# Patient Record
Sex: Female | Born: 1965 | Hispanic: No | Marital: Single | Smoking: Never smoker
Health system: Southern US, Community
[De-identification: ages and names within clinical notes are randomized; demographics above are authoritative.]

## PROBLEM LIST (undated history)

## (undated) DIAGNOSIS — T7840XA Allergy, unspecified, initial encounter: Secondary | ICD-10-CM

## (undated) DIAGNOSIS — G932 Benign intracranial hypertension: Secondary | ICD-10-CM

## (undated) DIAGNOSIS — K2970 Gastritis, unspecified, without bleeding: Secondary | ICD-10-CM

## (undated) DIAGNOSIS — E119 Type 2 diabetes mellitus without complications: Secondary | ICD-10-CM

## (undated) DIAGNOSIS — N200 Calculus of kidney: Secondary | ICD-10-CM

## (undated) HISTORY — PX: EYE SURGERY: SHX253

## (undated) HISTORY — PX: TUBAL LIGATION: SHX77

## (undated) HISTORY — DX: Allergy, unspecified, initial encounter: T78.40XA

## (undated) HISTORY — PX: HERNIA REPAIR: SHX51

## (undated) HISTORY — DX: Benign intracranial hypertension: G93.2

## (undated) HISTORY — DX: Type 2 diabetes mellitus without complications: E11.9

## (undated) HISTORY — DX: Calculus of kidney: N20.0

---

## 1997-10-31 ENCOUNTER — Other Ambulatory Visit: Admission: RE | Admit: 1997-10-31 | Discharge: 1997-10-31 | Payer: Self-pay | Admitting: Obstetrics & Gynecology

## 1997-11-11 ENCOUNTER — Inpatient Hospital Stay (HOSPITAL_COMMUNITY): Admission: AD | Admit: 1997-11-11 | Discharge: 1997-11-13 | Payer: Self-pay | Admitting: Obstetrics and Gynecology

## 1997-11-15 ENCOUNTER — Encounter: Admission: RE | Admit: 1997-11-15 | Discharge: 1998-02-13 | Payer: Self-pay | Admitting: Obstetrics & Gynecology

## 1997-12-11 ENCOUNTER — Encounter: Admission: RE | Admit: 1997-12-11 | Discharge: 1998-03-11 | Payer: Self-pay | Admitting: *Deleted

## 1998-10-12 ENCOUNTER — Other Ambulatory Visit: Admission: RE | Admit: 1998-10-12 | Discharge: 1998-10-12 | Payer: Self-pay | Admitting: Obstetrics & Gynecology

## 1998-12-17 ENCOUNTER — Inpatient Hospital Stay (HOSPITAL_COMMUNITY): Admission: AD | Admit: 1998-12-17 | Discharge: 1998-12-17 | Payer: Self-pay | Admitting: Obstetrics and Gynecology

## 1998-12-21 ENCOUNTER — Observation Stay (HOSPITAL_COMMUNITY): Admission: AD | Admit: 1998-12-21 | Discharge: 1998-12-22 | Payer: Self-pay | Admitting: *Deleted

## 1998-12-22 ENCOUNTER — Encounter: Payer: Self-pay | Admitting: *Deleted

## 1998-12-23 ENCOUNTER — Inpatient Hospital Stay (HOSPITAL_COMMUNITY): Admission: AD | Admit: 1998-12-23 | Discharge: 1998-12-23 | Payer: Self-pay | Admitting: Obstetrics and Gynecology

## 1999-01-07 ENCOUNTER — Observation Stay (HOSPITAL_COMMUNITY): Admission: AD | Admit: 1999-01-07 | Discharge: 1999-01-08 | Payer: Self-pay | Admitting: Obstetrics and Gynecology

## 1999-01-15 ENCOUNTER — Observation Stay (HOSPITAL_COMMUNITY): Admission: AD | Admit: 1999-01-15 | Discharge: 1999-01-16 | Payer: Self-pay | Admitting: Obstetrics and Gynecology

## 1999-01-26 ENCOUNTER — Observation Stay (HOSPITAL_COMMUNITY): Admission: AD | Admit: 1999-01-26 | Discharge: 1999-01-26 | Payer: Self-pay | Admitting: Obstetrics and Gynecology

## 1999-06-28 ENCOUNTER — Observation Stay (HOSPITAL_COMMUNITY): Admission: AD | Admit: 1999-06-28 | Discharge: 1999-06-29 | Payer: Self-pay | Admitting: *Deleted

## 1999-07-21 ENCOUNTER — Inpatient Hospital Stay (HOSPITAL_COMMUNITY): Admission: AD | Admit: 1999-07-21 | Discharge: 1999-07-23 | Payer: Self-pay | Admitting: Obstetrics & Gynecology

## 1999-10-19 ENCOUNTER — Emergency Department (HOSPITAL_COMMUNITY): Admission: EM | Admit: 1999-10-19 | Discharge: 1999-10-19 | Payer: Self-pay | Admitting: Emergency Medicine

## 1999-10-19 ENCOUNTER — Encounter: Payer: Self-pay | Admitting: Emergency Medicine

## 2000-02-18 ENCOUNTER — Other Ambulatory Visit: Admission: RE | Admit: 2000-02-18 | Discharge: 2000-02-18 | Payer: Self-pay | Admitting: Obstetrics and Gynecology

## 2000-10-10 ENCOUNTER — Emergency Department (HOSPITAL_COMMUNITY): Admission: EM | Admit: 2000-10-10 | Discharge: 2000-10-10 | Payer: Self-pay | Admitting: Emergency Medicine

## 2000-11-06 ENCOUNTER — Encounter: Admission: RE | Admit: 2000-11-06 | Discharge: 2000-11-06 | Payer: Self-pay | Admitting: Internal Medicine

## 2000-11-06 ENCOUNTER — Encounter: Payer: Self-pay | Admitting: Internal Medicine

## 2001-02-05 ENCOUNTER — Other Ambulatory Visit: Admission: RE | Admit: 2001-02-05 | Discharge: 2001-02-05 | Payer: Self-pay | Admitting: Internal Medicine

## 2001-02-26 ENCOUNTER — Encounter: Payer: Self-pay | Admitting: Internal Medicine

## 2001-02-26 ENCOUNTER — Encounter: Admission: RE | Admit: 2001-02-26 | Discharge: 2001-02-26 | Payer: Self-pay | Admitting: Internal Medicine

## 2001-04-13 ENCOUNTER — Emergency Department (HOSPITAL_COMMUNITY): Admission: EM | Admit: 2001-04-13 | Discharge: 2001-04-13 | Payer: Self-pay | Admitting: Emergency Medicine

## 2001-04-13 ENCOUNTER — Encounter: Payer: Self-pay | Admitting: Emergency Medicine

## 2001-06-11 ENCOUNTER — Other Ambulatory Visit: Admission: RE | Admit: 2001-06-11 | Discharge: 2001-06-11 | Payer: Self-pay | Admitting: Internal Medicine

## 2001-09-04 ENCOUNTER — Other Ambulatory Visit: Admission: RE | Admit: 2001-09-04 | Discharge: 2001-09-04 | Payer: Self-pay | Admitting: Internal Medicine

## 2003-07-31 ENCOUNTER — Emergency Department (HOSPITAL_COMMUNITY): Admission: AD | Admit: 2003-07-31 | Discharge: 2003-07-31 | Payer: Self-pay | Admitting: Family Medicine

## 2003-08-24 ENCOUNTER — Emergency Department (HOSPITAL_COMMUNITY): Admission: AD | Admit: 2003-08-24 | Discharge: 2003-08-24 | Payer: Self-pay | Admitting: Internal Medicine

## 2003-08-24 ENCOUNTER — Encounter: Payer: Self-pay | Admitting: Internal Medicine

## 2005-04-24 ENCOUNTER — Emergency Department (HOSPITAL_COMMUNITY): Admission: EM | Admit: 2005-04-24 | Discharge: 2005-04-24 | Payer: Self-pay | Admitting: Family Medicine

## 2005-07-12 ENCOUNTER — Other Ambulatory Visit: Admission: RE | Admit: 2005-07-12 | Discharge: 2005-07-12 | Payer: Self-pay | Admitting: Obstetrics and Gynecology

## 2005-09-16 ENCOUNTER — Encounter: Admission: RE | Admit: 2005-09-16 | Discharge: 2005-09-16 | Payer: Self-pay | Admitting: Internal Medicine

## 2006-04-13 ENCOUNTER — Other Ambulatory Visit: Admission: RE | Admit: 2006-04-13 | Discharge: 2006-04-13 | Payer: Self-pay | Admitting: Obstetrics and Gynecology

## 2007-02-01 ENCOUNTER — Encounter: Admission: RE | Admit: 2007-02-01 | Discharge: 2007-02-01 | Payer: Self-pay | Admitting: Internal Medicine

## 2007-08-12 ENCOUNTER — Emergency Department (HOSPITAL_COMMUNITY): Admission: EM | Admit: 2007-08-12 | Discharge: 2007-08-12 | Payer: Self-pay | Admitting: Emergency Medicine

## 2008-09-12 ENCOUNTER — Encounter: Admission: RE | Admit: 2008-09-12 | Discharge: 2008-09-12 | Payer: Self-pay | Admitting: Internal Medicine

## 2009-01-21 ENCOUNTER — Encounter: Admission: RE | Admit: 2009-01-21 | Discharge: 2009-01-21 | Payer: Self-pay | Admitting: Ophthalmology

## 2009-02-03 ENCOUNTER — Encounter: Admission: RE | Admit: 2009-02-03 | Discharge: 2009-02-03 | Payer: Self-pay | Admitting: Ophthalmology

## 2009-02-17 ENCOUNTER — Other Ambulatory Visit: Admission: RE | Admit: 2009-02-17 | Discharge: 2009-02-17 | Payer: Self-pay | Admitting: Ophthalmology

## 2009-02-17 ENCOUNTER — Encounter: Admission: RE | Admit: 2009-02-17 | Discharge: 2009-02-17 | Payer: Self-pay | Admitting: Ophthalmology

## 2009-02-17 ENCOUNTER — Encounter (INDEPENDENT_AMBULATORY_CARE_PROVIDER_SITE_OTHER): Payer: Self-pay | Admitting: Radiology

## 2009-12-15 ENCOUNTER — Encounter: Admission: RE | Admit: 2009-12-15 | Discharge: 2009-12-15 | Payer: Self-pay | Admitting: Internal Medicine

## 2010-08-22 ENCOUNTER — Encounter: Payer: Self-pay | Admitting: Internal Medicine

## 2010-08-23 ENCOUNTER — Encounter: Payer: Self-pay | Admitting: Internal Medicine

## 2010-09-25 ENCOUNTER — Emergency Department (HOSPITAL_COMMUNITY): Payer: 59

## 2010-09-25 ENCOUNTER — Emergency Department (HOSPITAL_COMMUNITY)
Admission: EM | Admit: 2010-09-25 | Discharge: 2010-09-25 | Disposition: A | Discharge status: Home / Self Care | Payer: 59 | Attending: Emergency Medicine | Admitting: Emergency Medicine

## 2010-09-25 DIAGNOSIS — R109 Unspecified abdominal pain: Secondary | ICD-10-CM | POA: Insufficient documentation

## 2010-09-25 DIAGNOSIS — K5900 Constipation, unspecified: Secondary | ICD-10-CM | POA: Insufficient documentation

## 2010-09-25 DIAGNOSIS — R071 Chest pain on breathing: Secondary | ICD-10-CM | POA: Insufficient documentation

## 2010-09-25 LAB — URINALYSIS, ROUTINE W REFLEX MICROSCOPIC
Hgb urine dipstick: NEGATIVE
Protein, ur: NEGATIVE mg/dL
Specific Gravity, Urine: 1.012 (ref 1.005–1.030)
Urine Glucose, Fasting: NEGATIVE mg/dL

## 2010-09-25 LAB — COMPREHENSIVE METABOLIC PANEL
ALT: 14 U/L (ref 0–35)
AST: 20 U/L (ref 0–37)
CO2: 24 mEq/L (ref 19–32)
Calcium: 8.8 mg/dL (ref 8.4–10.5)
Creatinine, Ser: 0.65 mg/dL (ref 0.4–1.2)
GFR calc non Af Amer: >60 mL/min (ref >60)
Glucose, Bld: 120 mg/dL — ABNORMAL HIGH (ref 70–99)
Sodium: 136 mEq/L (ref 135–145)
Total Protein: 7.8 g/dL (ref 6.0–8.3)

## 2010-09-25 LAB — POCT CARDIAC MARKERS
CKMB, poc: 1.1 ng/mL (ref 1.0–8.0)
Myoglobin, poc: 75.1 ng/mL (ref 12–200)

## 2010-12-17 NOTE — Discharge Summary (Signed)
[employer]  Patient:    Jasmine Bartlett                          MRN: [id number] Adm. Date:  [id number] Disc. Date: 07/23/99 Attending:  Cleatrice Burke DictatorVance Gather Duplantis, C.N.M.                           Discharge Summary  ADMISSION DIAGNOSES:          1. Intrauterine pregnancy at term.                               2. Spontaneous rupture of membranes.                               3. Early labor.                               4. 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                               5. Multiparity.                               6. Desires bilateral tubal ligation for                                  sterilization.  DISCHARGE DIAGNOSES:          1. Intrauterine pregnancy at term.                               2. Spontaneous rupture of membranes.                               3. Early labor.                               4. 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                               5. Multiparity.                               6. Desires bilateral tubal ligation for  sterilization.                               7. Breastfeeding.  PROCEDURE:                    1) Normal spontaneous vaginal delivery of a viable female infant who weighed 7 pounds 5 ounces and had Apgars of 8 and 9 on July 21, 1999, without complications.  She was delivered by Georgina Peer, M.D. 2) Bilateral tubal ligation for sterilization on July 22, 1999, by Cecilio Asper, M.D. also without complications.  HOSPITAL COURSE:              Jasmine Bartlett is a 45 year old black female, gravida , para 1-0-1-1, at 38-2/7 weeks admitted with rupture of membranes in early labor who progressed to a normal spontaneous vaginal delivery of a viable female infant who weighed 7 pounds 5 ounces and had  Apgars of 8 and 9 by Georgina Peer, M.D. without complications.  She also desired a bilateral tubal ligation and underwent the same on July 22, 1999, without complications by Cecilio Asper,  M.D.  Please see operative note for details.  Postoperatively and postpartally he has done well.  She is ambulating, voiding, and eating without difficulty.  She has not yet had a bowel movement and feels some discomfort secondary to gas, but is  tolerating a regular diet.  She is breastfeeding also without difficulty.  She s deemed ready for discharge today.  DISCHARGE INSTRUCTIONS:       As per the [facility] and Gynecology handout.  DISCHARGE MEDICATIONS:        1. Motrin 600 mg p.o. q.6h. p.r.n. for pain.                               2. Tylox one to two p.o. q.4-6h. p.r.n. for pain.                               3. Prenatal vitamins daily.  FOLLOW-UP:                    Will be in six weeks at [facility] and Gynecology.  DISCHARGE LABORATORY DATA:    Her hemoglobin is 9.7, WBC count 13.4, and platelets are 273. DD:  07/23/99 TD:  07/24/99 Job: [id number]

## 2010-12-17 NOTE — H&P (Signed)
[facility]  Patient:    Jasmine Bartlett                          MRN: [id number] Adm. Date:  [id number] Attending:  Pleas Koch Dictator:   Erin Sons, C.N.M.                         History and Physical  HISTORY OF PRESENT ILLNESS:   Mrs. Jasmine Bartlett is a 45 year old gravida 3, para 1-0-1-1, at 35-2/7 weeks, who presented to [facility] today with approximately  24-hour history of nausea and vomiting with questionable food poisoning since 11 p.m. last night.  She has had intractable vomiting since then.  Her husband nd child had similar symptoms, but they are now improved.  She denies any abdominal pain, cramping, dysuria, or back pain.  She ate greens and ham yesterday at approximately 2 p.m.  PAST HISTORY:                 The pregnancy has been remarkable for:                               1. Hyperemesis.                               2. History of HSV.                               3. Positive group B Streptococcus.                               4. Desires bilateral tubal ligation.                               5. History of hiatal hernia.  PRENATAL LABORATORY DATA:     Blood type is B positive.  Rh antibody negative.  VDRL nonreactive.  Rubella titer positive.  Hepatitis B surface antigen negative. Sickle cell test negative.  GC and Chlamydia cultures were negative.  Pap was normal.  Glucose challenge was normal.  AFP was normal.  Hemoglobin upon entry nto the practice was 12.9.  It was 11.8 at approximately 25-26 weeks.  EDC of August 01, 1999, was established by last menstrual period and was in agreement with ultrasound at approximately 18 weeks.  HISTORY OF PRESENT PREGNANCY: The patient entered care at approximately eight weeks.  She had significant nausea and vomiting in the early part of her pregnancy. She had several maternity admissions unit visits.  She was placed on home IV hydration, methylprednisolone p.o., and  home IV fluid therapy with Phenergan. he also had a Reglan top.  She was finally able to present for her new OB visit at  approximately 12 weeks.  Throughout the rest of her pregnancy she continued with sporadic maternity admission visits for nausea and vomiting.  She was diagnosed  with possible ringworm at 28 weeks and was treated for this.  She has elected bilateral tubal sterilization with permit signed in May 26, 1999.  The last few weeks of the patients pregnancy have been uncomplicated until the onset of the nausea and vomiting the  last 24 hours.  OBSTETRICAL HISTORY:          In 1994, she had a therapeutic termination of pregnancy.  In April 1999 she had a vaginal birth of a female infant at 7 pounds. She was 39 weeks.  She was in labor approximately 19 hours.  She had epidural anesthesia and vacuum-assisted vaginal birth.  She had hyperemesis with each of her pregnancies.  MEDICAL HISTORY:              She was on Tri-Levlen and Ortho-Novum 777 in the past. She had problems with bloating and nausea and vomiting.  She was on Micronor in  1999, but stopped in February.  In 1990 she had Chlamydia. She was diagnosed with HSV2 in 1994.  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  She has occasional yeast infections.  She does have history of beta Streptococcus with her previous pregnancy.  She has a history of hiatal hernia nd a history of chronic gastritis, but has never been on any medication.  She had n endoscopy in 1989 or 1990.  ALLERGIES:                    She has no known medication allergies.  FAMILY HISTORY:               Her maternal grandfather had an MI and is now deceased.  Her maternal grandmother and paternal grandmother were both adult-onset diabetics controlled by oral medications.  Her mother had glomerulonephritis. er maternal grandmother has Alzheimers.  Her maternal grandfather had prostate cancer.  GENETIC  HISTORY:              Unremarkable.  SOCIAL HISTORY:               The patient is married.  The father of the baby is generally supportive.  His name is Adelina Mings.  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  Her husband is high-school education and is employed in [employer]. She is African-American in ethnicity.  She is [place].  She denies any alcohol, drug, or tobacco use during this pregnancy.  PHYSICAL EXAMINATION  VITAL SIGNS:                  Stable.  Patient is afebrile.  HEENT:                        Within normal limits.  LUNGS:                        Bilateral breath sounds are clear.  CARDIAC:                      Heart regular rate and rhythm without murmur.  BREASTS:                      Soft and nontender.  ABDOMEN:                      Fundal height is approximately 35 cm.  Uterine contractions are every 16 minutes, mild quality.  PELVIC:                       Cervical exam is deferred.  Fetal heart rate is reactive in segments but did ave some sporadic mild variable noted.  There were some questionable baseline changes with some uterine contractions.  These may have been maternal tracing in some instances.  There is negative CVA tenderness.  LABORATORY DATA:              Clean catch urine at approximately 8 p.m. after approximately 1-1/2 hours of IV fluids showed specific gravity of 1.030, greater than 80 ketones, and 30 mg protein, and a trace of leukocyte esterase.  IMPRESSION:                   1. Intrauterine pregnancy at 35-2/7 weeks.                               2. Viral illness versus food exposure.                               3. Clinical fetal heart rate tracings probably                                  secondary to dehydration, now improving.  PLAN:                         1. Admit to [facility] for 23-hour observation                                  for rehydration and Phenergan IV.                                2. Will reevaluate status in the a.m. for possible                                  discharge.                               3. Will continue IV hydration throughout the night. DD:  06/28/99  TD:  06/28/99 Job: [id number]

## 2010-12-17 NOTE — Op Note (Signed)
[facility]  Patient:    Jasmine Bartlett                          MRN: [id number] Proc. Date: 07/22/99 Adm. Date:  [id number] Attending:  Cleatrice Burke                           Operative Report  PREOPERATIVE DIAGNOSIS:       Desires sterilization.  POSTOPERATIVE DIAGNOSIS:      Desires sterilization.  OPERATION:                    Postpartum bilateral tubal ligation.  SURGEON:                      Cecilio Asper, M.D.  ASSISTANT:  ANESTHESIA:                   Spinal.  ESTIMATED BLOOD LOSS:         Minimal.  FINDINGS:                     Normal tubes bilaterally.  COMPLICATIONS:                None.  INDICATIONS:                  The patient is a 45 year old, gravida 3, para 1-0-1-1, status post normal spontaneous vaginal delivery, desiring permanent sterilization.  The patient understands that the procedure is to be considered permanent and irreversible.  She understands there is a small risk of tubal failure and should failure occur, there is an increased risk of ectopic pregnancy.  She is therefore consented.  DESCRIPTION OF PROCEDURE:     After an adequate level of spinal anesthesia was obtained, the patient was prepped and draped in a sterile fashion.  An infraumbilical incision was made down through the subcutaneous tissue fat to the fascia.  The fascia was incised with the Mayo scissors.  The parietoperitoneum as grasped with Hemostats and incised with the Metzenbaum scissors.  Army-Navy retractors were placed inside of the incision.  The left fallopian tube was identified, followed out to its fimbriated end, elevated with a Babcock, and doubly tied with #0 plain.  A portion was cut and sent to pathology.  Hemostasis was assured.  The right fallopian tube was identified in like fashion, followed out to the fimbriated end, elevated with the [place], doubly tied with #0 plain.  A portion of this tube was sent to  pathology as well.  Hemostasis was assured. The fascia was reapproximated with a running suture of #1 Vicryl.  The skin was closed with a subcuticular #4-0 Vicryl.  10 cc of 0.5% Marcaine with epinephrine was injected into the incision.  Bandage was applied.  Sponge, needle, and instrument counts were correct x 2.  The patient tolerated the procedure well.  She was taken to the recovery room in stable condition. DD:  07/22/99 TD:  07/23/99 Job: [id number]

## 2011-01-17 ENCOUNTER — Other Ambulatory Visit: Payer: Self-pay | Admitting: Internal Medicine

## 2011-01-17 DIAGNOSIS — Z1231 Encounter for screening mammogram for malignant neoplasm of breast: Secondary | ICD-10-CM

## 2011-01-31 ENCOUNTER — Ambulatory Visit
Admission: RE | Admit: 2011-01-31 | Discharge: 2011-01-31 | Disposition: A | Discharge status: Home / Self Care | Payer: 59 | Source: Ambulatory Visit | Attending: Internal Medicine | Admitting: Internal Medicine

## 2011-01-31 DIAGNOSIS — Z1231 Encounter for screening mammogram for malignant neoplasm of breast: Secondary | ICD-10-CM

## 2011-04-21 LAB — URINALYSIS, ROUTINE W REFLEX MICROSCOPIC
Bilirubin Urine: NEGATIVE
Glucose, UA: NEGATIVE
Ketones, ur: 15 — AB
Nitrite: NEGATIVE
Protein, ur: NEGATIVE
Specific Gravity, Urine: 1.016
Urobilinogen, UA: 0.2
pH: 8

## 2011-04-21 LAB — COMPREHENSIVE METABOLIC PANEL
Alkaline Phosphatase: 99
BUN: 7
Calcium: 9.1
Creatinine, Ser: 0.79
Glucose, Bld: 114 — ABNORMAL HIGH
Total Protein: 7.8

## 2011-04-21 LAB — URINE MICROSCOPIC-ADD ON

## 2011-04-21 LAB — CBC
HCT: 36.5
Hemoglobin: 12.1
MCHC: 33.1
MCV: 86.2
RBC: 4.24
RDW: 14

## 2011-04-21 LAB — DIFFERENTIAL
Eosinophils Absolute: 0
Lymphs Abs: 0.7
Neutro Abs: 10 — ABNORMAL HIGH
Neutrophils Relative %: 89 — ABNORMAL HIGH

## 2011-04-21 LAB — PREGNANCY, URINE

## 2011-08-25 ENCOUNTER — Emergency Department (HOSPITAL_COMMUNITY): Payer: 59

## 2011-08-25 ENCOUNTER — Encounter (HOSPITAL_COMMUNITY): Payer: Self-pay | Admitting: *Deleted

## 2011-08-25 ENCOUNTER — Emergency Department (HOSPITAL_COMMUNITY)
Admission: EM | Admit: 2011-08-25 | Discharge: 2011-08-25 | Disposition: A | Discharge status: Home / Self Care | Payer: 59 | Attending: Emergency Medicine | Admitting: Emergency Medicine

## 2011-08-25 DIAGNOSIS — K449 Diaphragmatic hernia without obstruction or gangrene: Secondary | ICD-10-CM | POA: Insufficient documentation

## 2011-08-25 DIAGNOSIS — R109 Unspecified abdominal pain: Secondary | ICD-10-CM | POA: Insufficient documentation

## 2011-08-25 DIAGNOSIS — N200 Calculus of kidney: Secondary | ICD-10-CM

## 2011-08-25 DIAGNOSIS — N201 Calculus of ureter: Secondary | ICD-10-CM | POA: Insufficient documentation

## 2011-08-25 DIAGNOSIS — R10819 Abdominal tenderness, unspecified site: Secondary | ICD-10-CM | POA: Insufficient documentation

## 2011-08-25 LAB — CBC
HCT: 38.4 % (ref 36.0–46.0)
Hemoglobin: 12.4 g/dL (ref 12.0–15.0)
MCHC: 32.3 g/dL (ref 30.0–36.0)
MCV: 84.4 fL (ref 78.0–100.0)
RDW: 15.3 % (ref 11.5–15.5)

## 2011-08-25 LAB — URINE MICROSCOPIC-ADD ON

## 2011-08-25 LAB — WET PREP, GENITAL

## 2011-08-25 LAB — COMPREHENSIVE METABOLIC PANEL
Albumin: 4.2 g/dL (ref 3.5–5.2)
BUN: 9 mg/dL (ref 6–23)
CO2: 25 mEq/L (ref 19–32)
Calcium: 9.3 mg/dL (ref 8.4–10.5)
Chloride: 105 mEq/L (ref 96–112)
Creatinine, Ser: 0.71 mg/dL (ref 0.50–1.10)
GFR calc non Af Amer: >90 mL/min (ref >90)
Total Bilirubin: 0.2 mg/dL — ABNORMAL LOW (ref 0.3–1.2)

## 2011-08-25 LAB — DIFFERENTIAL
Basophils Absolute: 0 10*3/uL (ref 0.0–0.1)
Basophils Relative: 0 % (ref 0–1)
Eosinophils Relative: 1 % (ref 0–5)
Monocytes Absolute: 0.8 10*3/uL (ref 0.1–1.0)
Monocytes Relative: 6 % (ref 3–12)
Neutro Abs: 10.6 10*3/uL — ABNORMAL HIGH (ref 1.7–7.7)

## 2011-08-25 LAB — PREGNANCY, URINE: Preg Test, Ur: NEGATIVE

## 2011-08-25 LAB — URINALYSIS, ROUTINE W REFLEX MICROSCOPIC
Ketones, ur: NEGATIVE mg/dL
Leukocytes, UA: NEGATIVE
Nitrite: NEGATIVE
Protein, ur: NEGATIVE mg/dL

## 2011-08-25 LAB — LIPASE, BLOOD: Lipase: 39 U/L (ref 11–59)

## 2011-08-25 MED ORDER — OXYCODONE-ACETAMINOPHEN 5-325 MG PO TABS: 2.0000 | ORAL_TABLET | ORAL | Status: AC | PRN

## 2011-08-25 MED ORDER — MORPHINE SULFATE 4 MG/ML IJ SOLN
4.0000 mg | Freq: Once | INTRAMUSCULAR | Status: AC
  Administered 2011-08-25: 4 mg via INTRAVENOUS
  Filled 2011-08-25: qty 1

## 2011-08-25 MED ORDER — SODIUM CHLORIDE 0.9 % IV BOLUS (SEPSIS)
1000.0000 mL | Freq: Once | INTRAVENOUS | Status: AC
  Administered 2011-08-25: 1000 mL via INTRAVENOUS

## 2011-08-25 MED ORDER — IOHEXOL 300 MG/ML  SOLN
100.0000 mL | Freq: Once | INTRAMUSCULAR | Status: AC | PRN
  Administered 2011-08-25: 100 mL via INTRAVENOUS

## 2011-08-25 MED ORDER — KETOROLAC TROMETHAMINE 30 MG/ML IJ SOLN
30.0000 mg | Freq: Once | INTRAMUSCULAR | Status: AC
  Administered 2011-08-25: 30 mg via INTRAVENOUS
  Filled 2011-08-25: qty 1

## 2011-08-25 NOTE — ED Provider Notes (Signed)
History     CSN: [id number]  Arrival date & time 08/25/11  1525   None     Chief Complaint  Patient presents with  . Abdominal Pain    (Consider location/radiation/quality/duration/timing/severity/associated sxs/prior treatment) Patient is a 46 y.o. female presenting with abdominal pain. The history is provided by the patient. No language interpreter was used.  Abdominal Pain The primary symptoms of the illness include abdominal pain and vaginal bleeding. The primary symptoms of the illness do not include fever, shortness of breath, nausea, vomiting, diarrhea, dysuria or vaginal discharge. The onset of the illness was sudden. The problem has been gradually worsening.  The patient states that she believes she is currently not pregnant. The patient has not had a change in bowel habit. Symptoms associated with the illness do not include chills, anorexia, diaphoresis, heartburn, constipation, urgency, hematuria, frequency or back pain. Significant associated medical issues do not include PUD, GERD, inflammatory bowel disease, diabetes, sickle cell disease, gallstones, liver disease, substance abuse, diverticulitis or cardiac disease.   46 year old patient here today with complaint of mid abdominal pain. States that the pain came on around 12:30 to the right flank with a burning sensation that radiated to the right lower quadrant. Denies nausea vomiting or diarrhea. States the pain is 8/10 comes in waves at 10 out of 10. She took Aleve with some relief around 1:30. Past medical history of having her tubes tied. She also has a past medical history of constipation with fecal impaction. States that her last bowel movement this morning was normal no blood. She is on her menstrual cycle presently no dysuria no fever.  History reviewed. No pertinent past medical history.  History reviewed. No pertinent past surgical history.  No family history on file.  History  Substance Use Topics  . Smoking  status: Never Smoker   . Smokeless tobacco: Never Used  . Alcohol Use: Yes     occ    OB History    Grav Para Term Preterm Abortions TAB SAB Ect Mult Living                  Review of Systems  Constitutional: Negative for fever, chills and diaphoresis.  Respiratory: Negative for shortness of breath.   Gastrointestinal: Positive for abdominal pain. Negative for heartburn, nausea, vomiting, diarrhea, constipation and anorexia.  Genitourinary: Positive for vaginal bleeding. Negative for dysuria, urgency, frequency, hematuria and vaginal discharge.  Musculoskeletal: Negative for back pain.  All other systems reviewed and are negative.    Allergies  Diamox  Home Medications   Current Outpatient Rx  Name Route Sig Dispense Refill  . FOLIC ACID 1 MG PO TABS Oral Take 1 mg by mouth daily.    Marland Kitchen LORATADINE 10 MG PO TABS Oral Take 10 mg by mouth daily.    . ADULT MULTIVITAMIN W/MINERALS CH Oral Take 1 tablet by mouth daily.    Marland Kitchen NAPROXEN SODIUM 220 MG PO TABS Oral Take 440 mg by mouth daily as needed. pain    . RESTORA PO CAPS Oral Take 1 capsule by mouth daily.      BP 150/96  Pulse 91  Temp(Src) 98 F (36.7 C) (Oral)  Resp 20  Ht 5\' 1"  (1.549 m)  Wt 162 lb (73.483 kg)  BMI 30.61 kg/m2  SpO2 100%  LMP 08/23/2011  Physical Exam  Nursing note and vitals reviewed. Constitutional: She is oriented to person, place, and time. She appears well-developed and well-nourished.  HENT:  Head: Normocephalic and  atraumatic.  Eyes: Conjunctivae and EOM are normal. Pupils are equal, round, and reactive to light.  Neck: Normal range of motion. Neck supple.  Cardiovascular: Normal rate, regular rhythm, normal heart sounds and intact distal pulses.  Exam reveals no gallop and no friction rub.   No murmur heard. Pulmonary/Chest: Effort normal and breath sounds normal.  Abdominal: Soft. Bowel sounds are normal. She exhibits no distension. There is no hepatosplenomegaly, splenomegaly or  hepatomegaly. There is tenderness in the right lower quadrant and periumbilical area. There is no rebound, no guarding and no CVA tenderness. No hernia. Hernia confirmed negative in the ventral area, confirmed negative in the right inguinal area and confirmed negative in the left inguinal area.  Musculoskeletal: Normal range of motion. She exhibits no edema and no tenderness.  Neurological: She is alert and oriented to person, place, and time. She has normal reflexes.  Skin: Skin is warm and dry.  Psychiatric: She has a normal mood and affect.    ED Course  Procedures (including critical care time)   Labs Reviewed  CBC  DIFFERENTIAL  COMPREHENSIVE METABOLIC PANEL  LIPASE, BLOOD  URINALYSIS, ROUTINE W REFLEX MICROSCOPIC  PREGNANCY, URINE  GC/CHLAMYDIA PROBE AMP, GENITAL  WET PREP, GENITAL  POCT PREGNANCY, URINE   No results found.   No diagnosis found.    MDM  RLQ periumbilical pain that started today.  CT shows R kidney stone .5mm.  Will strain all urine.  Treat pain and follow up with urology tomorrow for R hydronephrosis.   Labs Reviewed  CBC - Abnormal; Notable for the following:    WBC 13.0 (*)    All other components within normal limits  DIFFERENTIAL - Abnormal; Notable for the following:    Neutrophils Relative 82 (*)    Neutro Abs 10.6 (*)    Lymphocytes Relative 11 (*)    All other components within normal limits  COMPREHENSIVE METABOLIC PANEL - Abnormal; Notable for the following:    Total Protein 9.0 (*)    Alkaline Phosphatase 136 (*)    Total Bilirubin 0.2 (*)    All other components within normal limits  URINALYSIS, ROUTINE W REFLEX MICROSCOPIC - Abnormal; Notable for the following:    Hgb urine dipstick MODERATE (*)    All other components within normal limits  WET PREP, GENITAL - Abnormal; Notable for the following:    WBC, Wet Prep HPF POC FEW (*)    All other components within normal limits  LIPASE, BLOOD  PREGNANCY, URINE  GC/CHLAMYDIA PROBE  AMP, GENITAL  URINE MICROSCOPIC-ADD ON  POCT PREGNANCY, URINE          Jethro Bastos, NP 08/26/11 1225

## 2011-08-25 NOTE — ED Notes (Signed)
Pt states she is on her period currently.

## 2011-08-25 NOTE — ED Notes (Signed)
Pt states today she started to have right side pain that radiates towards her pelvic area. Pt denies any n/v or urination difficulty

## 2011-08-25 NOTE — ED Notes (Signed)
Pt is alert and oriented x 4 with respirations even and unlabored.  NAD at this time.  Discharge instructions reviewed with patient and patient verbalized understanding.  Pt ambulated to lobby with steady gait and significant other to transport patient home.

## 2011-08-26 ENCOUNTER — Encounter (HOSPITAL_COMMUNITY): Payer: Self-pay | Admitting: Emergency Medicine

## 2011-08-26 ENCOUNTER — Emergency Department (HOSPITAL_COMMUNITY)
Admission: EM | Admit: 2011-08-26 | Discharge: 2011-08-27 | Disposition: A | Discharge status: Home / Self Care | Payer: 59 | Attending: Emergency Medicine | Admitting: Emergency Medicine

## 2011-08-26 DIAGNOSIS — R300 Dysuria: Secondary | ICD-10-CM | POA: Insufficient documentation

## 2011-08-26 DIAGNOSIS — R3915 Urgency of urination: Secondary | ICD-10-CM | POA: Insufficient documentation

## 2011-08-26 DIAGNOSIS — R231 Pallor: Secondary | ICD-10-CM | POA: Insufficient documentation

## 2011-08-26 DIAGNOSIS — Z87442 Personal history of urinary calculi: Secondary | ICD-10-CM | POA: Insufficient documentation

## 2011-08-26 DIAGNOSIS — R110 Nausea: Secondary | ICD-10-CM | POA: Insufficient documentation

## 2011-08-26 HISTORY — DX: Gastritis, unspecified, without bleeding: K29.70

## 2011-08-26 HISTORY — DX: Benign intracranial hypertension: G93.2

## 2011-08-26 LAB — POCT I-STAT, CHEM 8
HCT: 41 % (ref 36.0–46.0)
Hemoglobin: 13.9 g/dL (ref 12.0–15.0)
Sodium: 141 mEq/L (ref 135–145)
TCO2: 25 mmol/L (ref 0–100)

## 2011-08-26 MED ORDER — ONDANSETRON HCL 4 MG/2ML IJ SOLN
4.0000 mg | Freq: Once | INTRAMUSCULAR | Status: AC
  Administered 2011-08-26: 4 mg via INTRAVENOUS
  Filled 2011-08-26: qty 2

## 2011-08-26 MED ORDER — SODIUM CHLORIDE 0.9 % IV SOLN
Freq: Once | INTRAVENOUS | Status: AC
  Administered 2011-08-26: 21:00:00 via INTRAVENOUS

## 2011-08-26 NOTE — ED Notes (Signed)
Bladder scan 148ml

## 2011-08-26 NOTE — ED Notes (Signed)
PA at bedside.

## 2011-08-26 NOTE — ED Provider Notes (Signed)
Medical screening examination/treatment/procedure(s) were performed by non-physician practitioner and as supervising physician I was immediately available for consultation/collaboration.    Celene Kras, MD 08/26/11 [phone]

## 2011-08-26 NOTE — ED Notes (Signed)
Pt presented to the ER with c/o pelvic pain and pressure, pt states that she was seen yesterday in the ER for the flank/abd pain, pt at that time Dx with kidney stones, pt there after sent home with instructions and verbalized understanding, however, states that s/s worsen, n/v developed, started also to have urinary urgency and painful urination.

## 2011-08-27 MED ORDER — ONDANSETRON HCL 4 MG PO TABS: 4.0000 mg | ORAL_TABLET | Freq: Four times a day (QID) | ORAL | Status: AC

## 2011-08-27 NOTE — ED Provider Notes (Signed)
History     CSN: [id number]  Arrival date & time 08/26/11  1925   First MD Initiated Contact with Patient 08/26/11 2057      Chief Complaint  Patient presents with  . Urinary Urgency  . Nausea  . Emesis  . Pelvic Pain    pt states "pressure"    (Consider location/radiation/quality/duration/timing/severity/associated sxs/prior treatment) HPI Comments: Patient was diagnosed with kidney stones with right hydronephrosis.  Sent home with Percocet and instructions to make an appointment for followup with urology.  She has an appointment next Tuesday with, urologist.  Geronimo Running been taking the Percocet with relief of her pain, but she has been nauseated and unable to stay hydrated throughout the day presenting to the emergency room tonight with nausea and vomiting  The history is provided by the patient.    Past Medical History  Diagnosis Date  . Diabetes mellitus   . Pseudotumor cerebri     increase of ICF  . Gastritis     Past Surgical History  Procedure Date  . Tubal ligation   . Hernia repair     History reviewed. No pertinent family history.  History  Substance Use Topics  . Smoking status: Never Smoker   . Smokeless tobacco: Never Used  . Alcohol Use: Yes     occ    OB History    Grav Para Term Preterm Abortions TAB SAB Ect Mult Living                  Review of Systems  Constitutional: Negative for fever and chills.  Gastrointestinal: Positive for nausea and vomiting.  Genitourinary: Positive for dysuria and decreased urine volume. Negative for hematuria and flank pain.  Skin: Negative for pallor.  Neurological: Negative for dizziness.    Allergies  Diamox  Home Medications   Current Outpatient Rx  Name Route Sig Dispense Refill  . FOLIC ACID 1 MG PO TABS Oral Take 1 mg by mouth daily.    Marland Kitchen LORATADINE 10 MG PO TABS Oral Take 10 mg by mouth daily.    . ADULT MULTIVITAMIN W/MINERALS CH Oral Take 1 tablet by mouth daily.    Marland Kitchen NAPROXEN SODIUM 220 MG PO  TABS Oral Take 440 mg by mouth daily as needed. pain    . OXYCODONE-ACETAMINOPHEN 5-325 MG PO TABS Oral Take 2 tablets by mouth every 4 (four) hours as needed for pain. 12 tablet 0  . RESTORA PO CAPS Oral Take 1 capsule by mouth daily.    Marland Kitchen ONDANSETRON HCL 4 MG PO TABS Oral Take 1 tablet (4 mg total) by mouth every 6 (six) hours. 12 tablet 0    BP 132/75  Pulse 74  Temp(Src) 98.4 F (36.9 C) (Oral)  Resp 15  SpO2 100%  LMP 08/23/2011  Physical Exam  Constitutional: She is oriented to person, place, and time. She appears well-developed and well-nourished.  HENT:  Head: Normocephalic.  Eyes: Pupils are equal, round, and reactive to light.  Neck: Normal range of motion.  Cardiovascular: Normal rate.   Pulmonary/Chest: Effort normal.  Abdominal: Soft.  Musculoskeletal: Normal range of motion.  Neurological: She is alert and oriented to person, place, and time.  Skin: Skin is warm and dry. There is pallor.  Psychiatric: She has a normal mood and affect.    ED Course  Procedures (including critical care time)  Labs Reviewed  POCT I-STAT, CHEM 8 - Abnormal; Notable for the following:    Glucose, Bld 103 (*)  All other components within normal limits  I-STAT, CHEM 8   Ct Abdomen Pelvis W Contrast  08/25/2011  *RADIOLOGY REPORT*  Clinical Data: Acute onset right side abdominal pain for 1 day.  CT ABDOMEN AND PELVIS WITH CONTRAST  Technique:  Multidetector CT imaging of the abdomen and pelvis was performed following the standard protocol during bolus administration of intravenous contrast.  Contrast: OMNIPAQUE IOHEXOL 300 MG/ML IV SOLN  Comparison: Chest and two views abdomen 09/25/2010.  Findings: Lung bases are clear.  No pleural or pericardial effusion.  Small hiatal hernia is noted.  The patient's moderate to moderately severe right hydronephrosis with stranding about the right kidney and ureter.  There is a right ureteral stone just below the pelvic brim measuring 0.5 cm in  diameter.  No other urinary tract stones are seen on the right or left.  The left kidney appears normal.  The liver, gallbladder, spleen, adrenal glands and pancreas appear normal.  Uterus, adnexa and urinary bladder are unremarkable.  The small and large bowel are unremarkable.  The appendix is not visualized but no evidence inflammatory process is seen.  No fluid collection lymphadenopathy.  No focal bony abnormality.  IMPRESSION:  1.  Moderate to moderately severe right hydronephrosis due to a right ureteral stone just below the pelvic brim. 2.  Small hiatal hernia.  Original Report Authenticated By: Bernadene Bell. D'ALESSIO, M.D.     1. Nausea       MDM  The patient was gently hydrated.  She was given Zofran IV for her nausea chest magnified episodes of emesis while she is in the emergency department.  I checked her i-STAT, and her BUN and creatinine are within normal range.  Encourage the patient to take Zofran and Percocet at home as needed.  For pain, nausea, and followup with urology as previously scheduled        Arman Filter, NP 08/27/11 0057  Arman Filter, NP 08/27/11 0057  Arman Filter, NP 08/27/11 0058  Arman Filter, NP 08/27/11 0059  Arman Filter, NP 08/27/11 [id number]

## 2011-08-28 NOTE — ED Provider Notes (Signed)
Medical screening examination/treatment/procedure(s) were performed by non-physician practitioner and as supervising physician I was immediately available for consultation/collaboration.   Benny Lennert, MD 08/28/11 1544

## 2011-09-01 ENCOUNTER — Encounter (HOSPITAL_BASED_OUTPATIENT_CLINIC_OR_DEPARTMENT_OTHER): Admission: RE | Payer: Self-pay | Source: Ambulatory Visit

## 2011-09-01 ENCOUNTER — Ambulatory Visit (HOSPITAL_BASED_OUTPATIENT_CLINIC_OR_DEPARTMENT_OTHER): Admission: RE | Admit: 2011-09-01 | Payer: 59 | Source: Ambulatory Visit | Admitting: Urology

## 2011-09-01 SURGERY — CYSTOSCOPY, WITH STENT INSERTION
Anesthesia: General

## 2011-10-03 ENCOUNTER — Other Ambulatory Visit: Payer: Self-pay | Admitting: Otolaryngology

## 2011-10-03 DIAGNOSIS — M542 Cervicalgia: Secondary | ICD-10-CM

## 2011-10-07 ENCOUNTER — Other Ambulatory Visit: Payer: 59

## 2011-10-10 ENCOUNTER — Ambulatory Visit
Admission: RE | Admit: 2011-10-10 | Discharge: 2011-10-10 | Disposition: A | Discharge status: Home / Self Care | Payer: 59 | Source: Ambulatory Visit | Attending: Otolaryngology | Admitting: Otolaryngology

## 2011-10-10 ENCOUNTER — Other Ambulatory Visit: Payer: 59

## 2011-10-10 DIAGNOSIS — M542 Cervicalgia: Secondary | ICD-10-CM

## 2011-10-10 MED ORDER — IOHEXOL 300 MG/ML  SOLN
75.0000 mL | Freq: Once | INTRAMUSCULAR | Status: AC | PRN
  Administered 2011-10-10: 75 mL via INTRAVENOUS

## 2011-12-03 ENCOUNTER — Ambulatory Visit (INDEPENDENT_AMBULATORY_CARE_PROVIDER_SITE_OTHER): Payer: 59 | Admitting: Family Medicine

## 2011-12-03 VITALS — BP 152/85 | HR 102 | Temp 98.4°F | Resp 18 | Ht 61.0 in | Wt 150.0 lb

## 2011-12-03 DIAGNOSIS — K1121 Acute sialoadenitis: Secondary | ICD-10-CM

## 2011-12-03 DIAGNOSIS — K1120 Sialoadenitis, unspecified: Secondary | ICD-10-CM

## 2011-12-03 MED ORDER — FLUCONAZOLE 150 MG PO TABS: 150.0000 mg | ORAL_TABLET | Freq: Once | ORAL | Status: AC

## 2011-12-03 MED ORDER — AMOXICILLIN-POT CLAVULANATE 875-125 MG PO TABS: 1.0000 | ORAL_TABLET | Freq: Two times a day (BID) | ORAL | Status: AC

## 2011-12-03 MED ORDER — HYDROCODONE-ACETAMINOPHEN 5-500 MG PO TABS: 1.0000 | ORAL_TABLET | Freq: Three times a day (TID) | ORAL | Status: AC | PRN

## 2011-12-03 NOTE — Patient Instructions (Signed)
Salivary Gland Infection   A salivary gland infection can be caused by a virus, bacteria from the mouth, or a stone. Mumps and other viruses may settle in one or more of the saliva glands. This will result in swelling, pain, and difficulty eating. Bacteria may cause a more severe infection in a salivary gland. A salivary stone blocking the flow of saliva can make this worse. These infections may be related to other medical problems. Some of these are dehydration, recent surgery, poor nutrition, and some medications.   TREATMENT   Treatment of a salivary gland infection depends on the cause. Mumps and other virus infections do not require antibiotics. If bacteria cause the infection, then antibiotics are needed to get rid of the infection. If there is a salivary stone blocking the duct, minor surgery to remove the stone may be needed.   HOME CARE INSTRUCTIONS   Get plenty of rest, increase your fluids, and use warm compresses on the swollen area for 15 to 20 minutes 4 times per day or as often as feels good to you.   Suck on hard candy or chew sugarless gum to promote saliva production.   Only take over-the-counter or prescription medicines for pain, discomfort, or fever as directed by your caregiver.   SEEK IMMEDIATE MEDICAL CARE IF:   You have increased swelling or pain or pain not relieved with medications.   You develop chills or a fever.   Any of your problems are getting worse rather than better.   Document Released: 08/25/2004 Document Revised: 07/07/2011 Document Reviewed: 07/18/2005   [employer]® Patient Information ©2012 [employer].

## 2011-12-03 NOTE — Progress Notes (Signed)
This 46 year old woman comes in with left facial pain lower mandible but started yesterday. She is scheduled to have a crown done on her tooth #18 on Monday morning. She's had a salivary gland infection in the past, and has had an MRI of this area ordered by Dr. Annalee Genta within the last year. The results of that were negative for significant disease. She's been told to take lemon drops  Objective: Patient appears mildly uncomfortable but is alert and cooperative  HEENT: Unremarkable except for tender left submandibular gland which is swollen as well to about twice its normal size. It is obviously swollen from just gross inspection as well. Her teeth look to be in reasonably good repair and there is no gingival swelling or redness at the salivary gland orifices.  Assessment: Sialoadenitis, acute, left sided  Plan:  augmentin 875 bid x 7 days with food, vicodin for pain Call if sx worsen or do not improve.

## 2011-12-07 ENCOUNTER — Telehealth: Payer: Self-pay

## 2011-12-07 NOTE — Telephone Encounter (Signed)
Gave pt message from [place] and D/W pt maintaining hydration and suggested probiotics and diet suggestions. Pt stated that diarrhea didn't start until yesterday and is not quite as bad today (she is only having diarrhea Q 2-3 hrs today). She has not been able to RTW though and wonders if she can get an [facility] note for Monday through today. Pt plans to try to RTW tomorrow. Is it OK to give her note?

## 2011-12-07 NOTE — Telephone Encounter (Signed)
Diarrhea is common with all antibiotics. It is not a good idea to stop it. Take medication with food.

## 2011-12-07 NOTE — Telephone Encounter (Signed)
PT SAW DR L AND WAS GIVEN AN ANTIBOTIC. SHE FEELS IT IS HELPING, BUT IT IS GIVING HER DIARRHEA. PLEASE CALL TO ADVISE

## 2011-12-08 NOTE — Telephone Encounter (Signed)
Letter written and given to pt by Launa Flight, RN

## 2012-01-25 ENCOUNTER — Other Ambulatory Visit: Payer: Self-pay | Admitting: Internal Medicine

## 2012-01-25 DIAGNOSIS — Z1231 Encounter for screening mammogram for malignant neoplasm of breast: Secondary | ICD-10-CM

## 2012-02-28 ENCOUNTER — Ambulatory Visit (INDEPENDENT_AMBULATORY_CARE_PROVIDER_SITE_OTHER): Payer: 59 | Admitting: Physician Assistant

## 2012-02-28 VITALS — BP 137/87 | HR 94 | Temp 97.8°F | Resp 18 | Ht 61.5 in | Wt 168.0 lb

## 2012-02-28 DIAGNOSIS — B9789 Other viral agents as the cause of diseases classified elsewhere: Secondary | ICD-10-CM

## 2012-02-28 DIAGNOSIS — B309 Viral conjunctivitis, unspecified: Secondary | ICD-10-CM

## 2012-02-28 DIAGNOSIS — H18609 Keratoconus, unspecified, unspecified eye: Secondary | ICD-10-CM | POA: Insufficient documentation

## 2012-02-28 MED ORDER — TOBRAMYCIN 0.3 % OP SOLN: 1.0000 [drp] | OPHTHALMIC | Status: AC

## 2012-02-28 NOTE — Progress Notes (Signed)
  Subjective:    Patient ID: Jasmine Bartlett, female    DOB: [birth year], 46 y.o.   MRN: [id number]  HPI 45yo AAF presents with R eye redness and on and off pain X2 days.  Now the L eye seems to be getting red and tender.  She feels overall rundown, congested, and a little achy. 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  Review of Systems  All other systems reviewed and are negative.       Objective:   Physical Exam  Nursing note and vitals reviewed. Constitutional: She is oriented to person, place, and time. She appears well-developed and well-nourished.  HENT:  Head: Normocephalic and atraumatic.  Right Ear: External ear normal.  Left Ear: External ear normal.  Mouth/Throat: No oropharyngeal exudate (throat w PND).  Eyes: EOM and lids are normal. Pupils are equal, round, and reactive to light. Right eye exhibits no chemosis, no discharge, no exudate and no hordeolum. Left eye exhibits no chemosis, no discharge, no exudate and no hordeolum. Right conjunctiva is not injected. Left conjunctiva is not injected. No scleral icterus.       B conjunctivae w/ erythema R>L.    Neck: Normal range of motion. Neck supple. No thyromegaly present.  Cardiovascular: Normal rate, regular rhythm and normal heart sounds.   Pulmonary/Chest: Effort normal and breath sounds normal.  Lymphadenopathy:    She has no cervical adenopathy.  Neurological: She is alert and oriented to person, place, and time.  Skin: Skin is warm and dry.  +preauricular Lymphadenopathy B R>L     Assessment & Plan:  Viral conjunctivitis-likely adenovirus. Antibiotics sent to pharmacy in case it seems to change to secondary bacterial infection. OTC meds for other s/sx.

## 2012-03-02 ENCOUNTER — Ambulatory Visit: Payer: 59

## 2012-03-09 ENCOUNTER — Ambulatory Visit
Admission: RE | Admit: 2012-03-09 | Discharge: 2012-03-09 | Disposition: A | Discharge status: Home / Self Care | Payer: 59 | Source: Ambulatory Visit | Attending: Internal Medicine | Admitting: Internal Medicine

## 2012-03-09 DIAGNOSIS — Z1231 Encounter for screening mammogram for malignant neoplasm of breast: Secondary | ICD-10-CM

## 2012-03-27 IMAGING — CT CT NECK W/ CM
4 of 5 series · 16 of 33 positions shown, 19 images · IV contrast (75CC OMNI 300)
Comparison: None.

CLINICAL DATA: Neck pain and swelling since July 2011.  The
area is not palpable today.  Cervicalgia.  723.1.

CT NECK WITH CONTRAST
TECHNIQUE: Multidetector CT imaging of the neck was performed with
intravenous contrast.
Contrast: 75mL OMNIPAQUE IOHEXOL 300 MG/ML IJ SOLN

[Series 2: axial neck · axial · 0.43mm/px · z∈[-53,+100]mm · 5 of 93 slices shown, 7 images]
[im 16/93  soft-tissue]
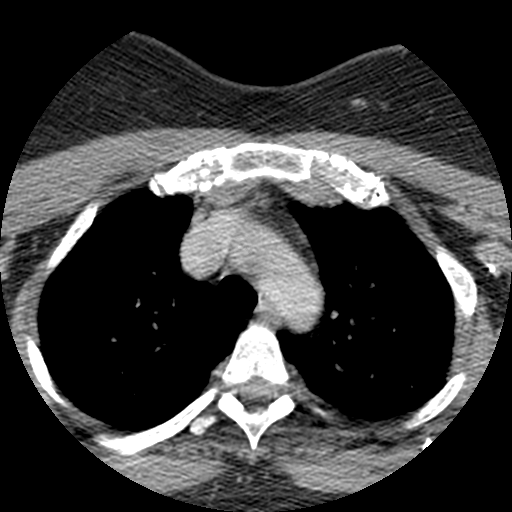
[im 16/93  bone]
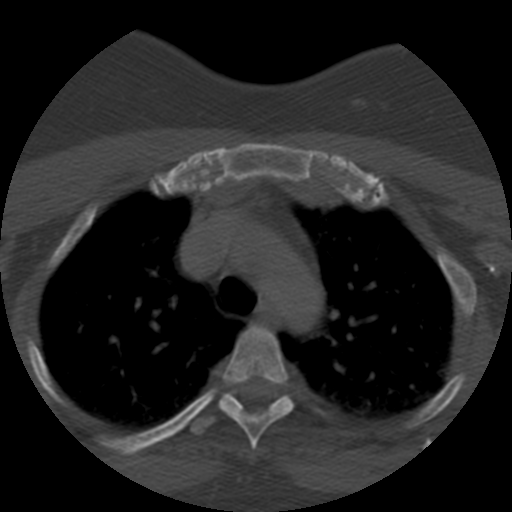
[im 31/93  bone]
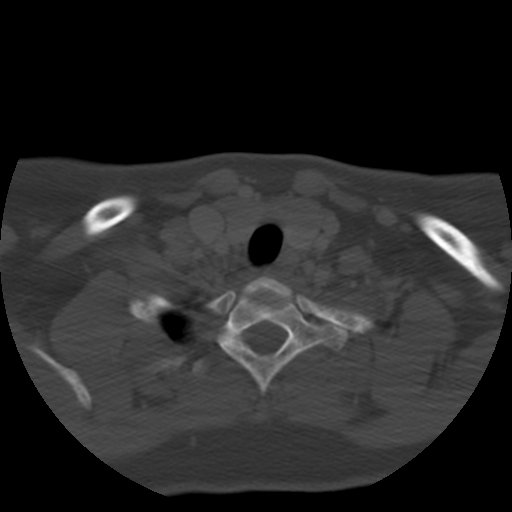
[im 47/93  bone]
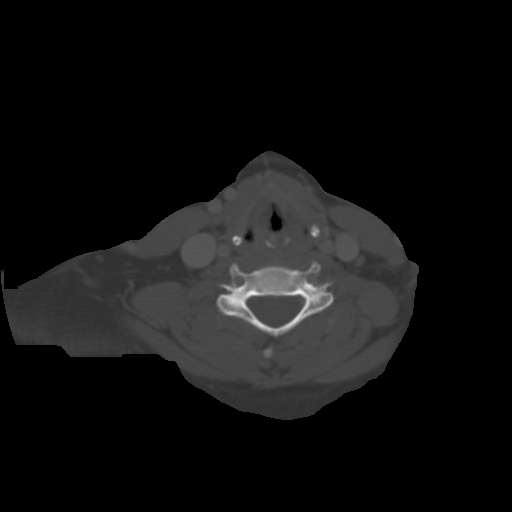
[im 62/93  bone]
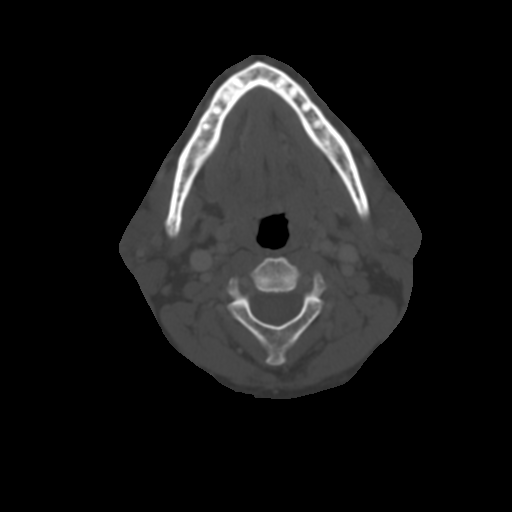
[im 77/93  soft-tissue]
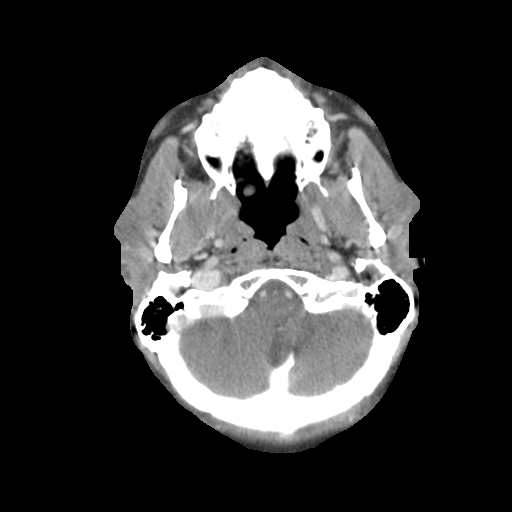
[im 77/93  bone]
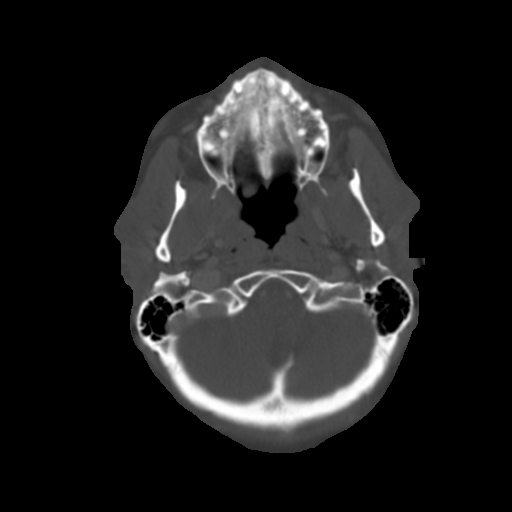

[Series 400: cor · coronal · 0.46mm/px · 3 of 84 slices shown]
[im 17/84  bone]
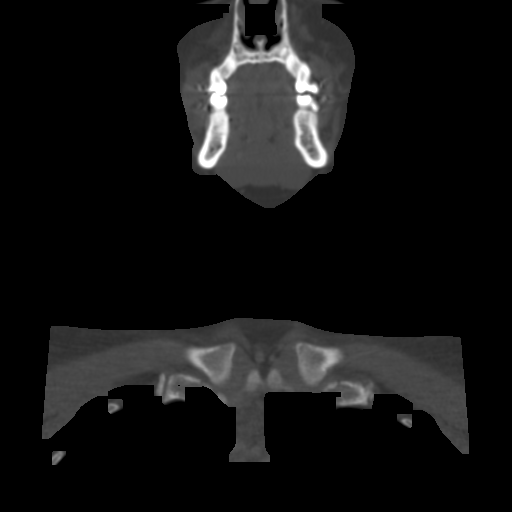
[im 34/84  bone]
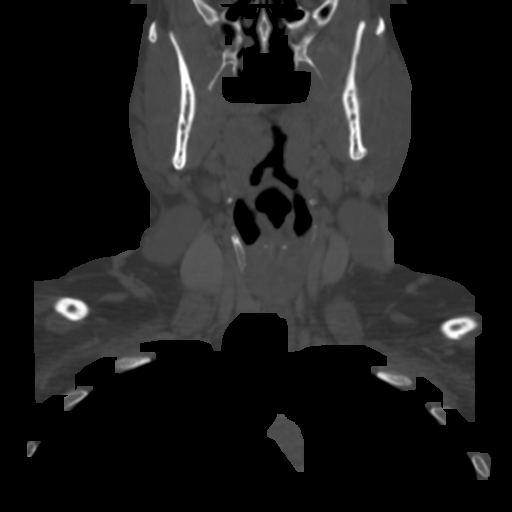
[im 50/84  bone]
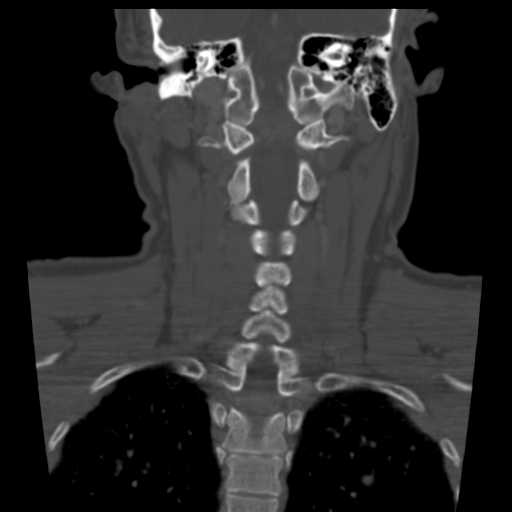

[Series 401: ang axials · axial · 0.46mm/px · z∈[-58,+31]mm · 3 of 91 slices shown]
[im 19/91  bone]
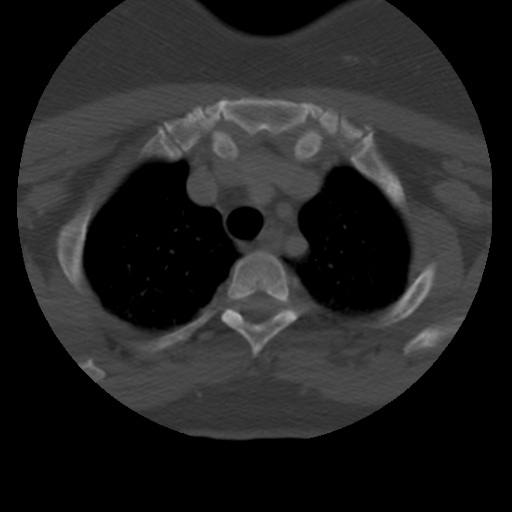
[im 37/91  bone]
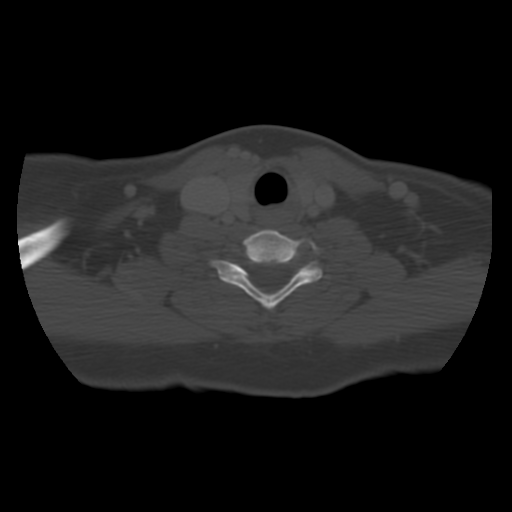
[im 55/91  bone]
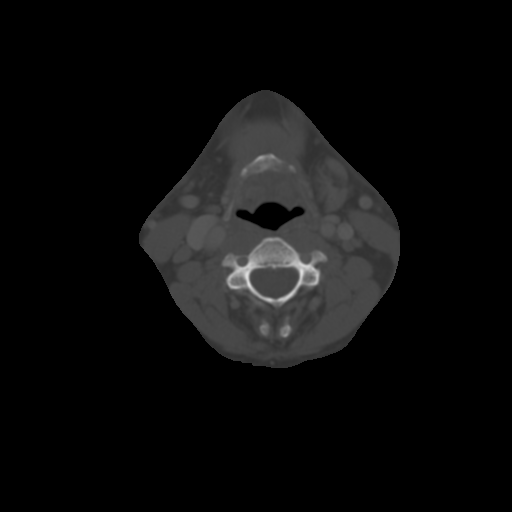

[Series 402: sag · sagittal · 0.46mm/px · 5 of 74 slices shown, 6 images]
[im 25/74  bone]
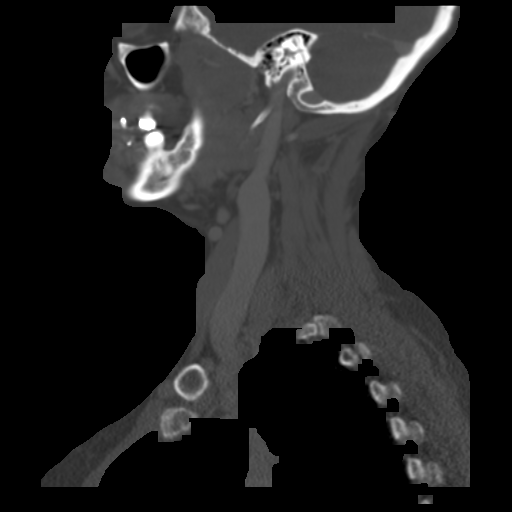
[im 31/74  bone]
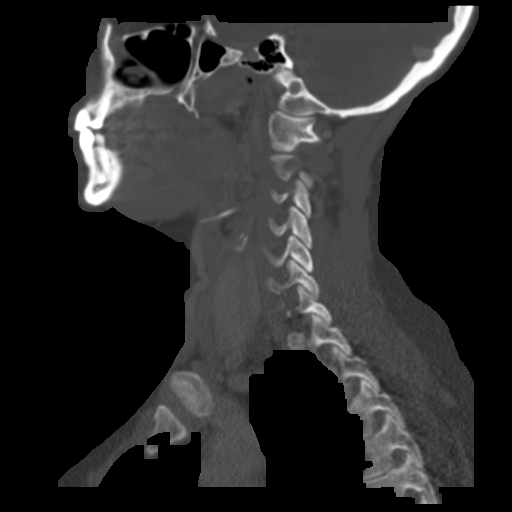
[im 37/74  soft-tissue]
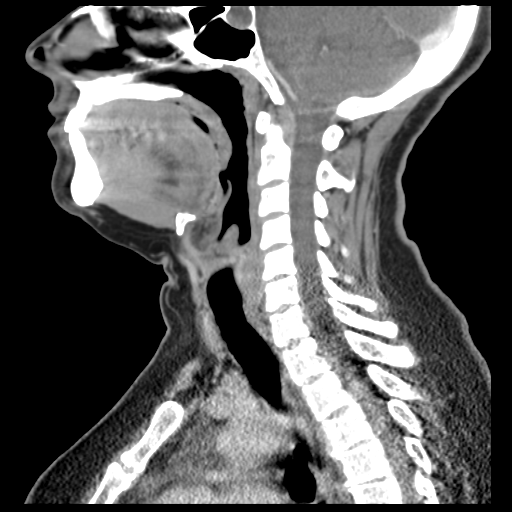
[im 37/74  bone]
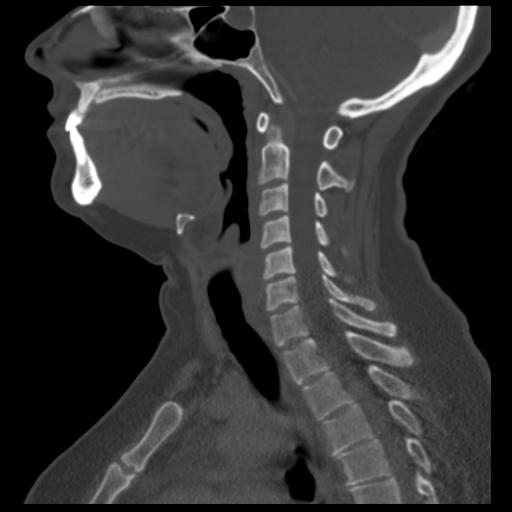
[im 43/74  bone]
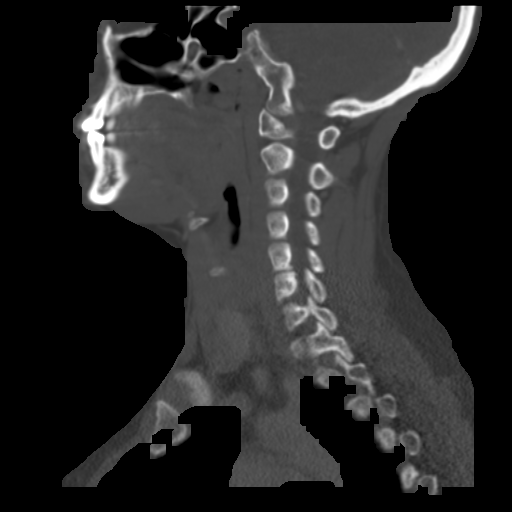
[im 49/74  bone]
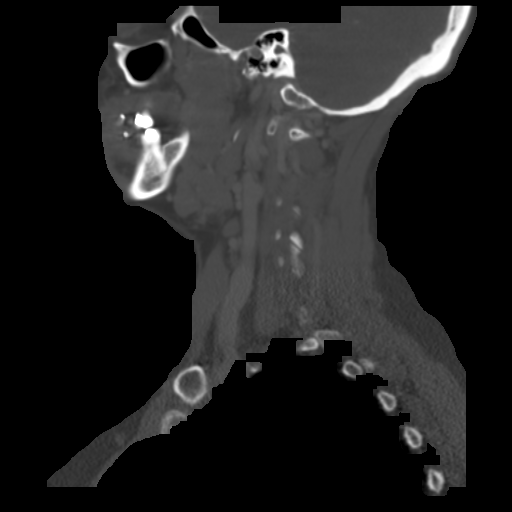

[16 of 33 positions shown; findings below may reference images not displayed]

FINDINGS: No focal mucosal or submucosal lesions are evident.
There is slight fullness of the palatine tonsils bilaterally.
These should be amenable to direct visualization.  The tongue base
is unremarkable.  The vocal cords are midline and symmetric.  No
significant adenopathy is present.  An ovoid borderline sized
posterior right level II node measures 16 mm.  The parotid glands
are within normal limits bilaterally.  The right to submandibular
gland is small and may be partially resected.

The bone windows demonstrate no focal lytic or blastic lesions.

The lung apices are clear.
IMPRESSION: 1.  No focal lesion to explain the patient's symptoms.
2.  No significant cervical adenopathy.
3.  Asymmetric small right submandibular gland.  This may be
congenital or post surgical.

## 2012-05-23 ENCOUNTER — Encounter: Payer: Self-pay | Admitting: Obstetrics and Gynecology

## 2012-05-23 ENCOUNTER — Ambulatory Visit (INDEPENDENT_AMBULATORY_CARE_PROVIDER_SITE_OTHER): Payer: 59 | Admitting: Obstetrics and Gynecology

## 2012-05-23 VITALS — BP 120/80 | HR 68 | Resp 16 | Ht 61.0 in | Wt 169.0 lb

## 2012-05-23 DIAGNOSIS — Z124 Encounter for screening for malignant neoplasm of cervix: Secondary | ICD-10-CM

## 2012-05-23 DIAGNOSIS — Z01419 Encounter for gynecological examination (general) (routine) without abnormal findings: Secondary | ICD-10-CM

## 2012-05-23 NOTE — Progress Notes (Signed)
Contraception Condoms Last pap 06/01/2011 WNL Last Mammo 01/2012 WNL Last Colonoscopy 2012 WNL Last Dexa Scan None Primary MD Dorothyann Peng Abuse at Home None  Filed Vitals:   05/23/12 1050  BP: 120/80  Pulse: 68  Resp: 16   ROS: noncontributory  Physical Examination: General appearance - alert, well appearing, and in no distress Neck - supple, no significant adenopathy Chest - clear to auscultation, no wheezes, rales or rhonchi, symmetric air entry Heart - normal rate and regular rhythm Abdomen - soft, nontender, nondistended, no masses or organomegaly Breasts - breasts appear normal, no suspicious masses, no skin or nipple changes or axillary nodes Pelvic - normal external genitalia, vulva, vagina, cervix, uterus and adnexa Back exam - no CVAT Extremities - no edema, redness or tenderness in the calves or thighs, inner left thigh with less than a cm lesion with appearance of skin tag  A/P Pap today AEX 100yr Next available to excise lesion

## 2012-05-24 LAB — PAP IG W/ RFLX HPV ASCU

## 2012-06-26 ENCOUNTER — Ambulatory Visit: Payer: 59

## 2012-06-26 ENCOUNTER — Encounter: Payer: 59 | Admitting: Obstetrics and Gynecology

## 2012-07-10 ENCOUNTER — Telehealth: Payer: Self-pay | Admitting: Obstetrics and Gynecology

## 2012-07-11 ENCOUNTER — Encounter: Payer: Self-pay | Admitting: Obstetrics and Gynecology

## 2012-07-11 ENCOUNTER — Ambulatory Visit: Payer: 59

## 2012-07-11 ENCOUNTER — Ambulatory Visit (INDEPENDENT_AMBULATORY_CARE_PROVIDER_SITE_OTHER): Payer: 59 | Admitting: Obstetrics and Gynecology

## 2012-07-11 VITALS — BP 112/70 | Resp 16 | Ht 61.0 in | Wt 175.0 lb

## 2012-07-11 DIAGNOSIS — R1031 Right lower quadrant pain: Secondary | ICD-10-CM

## 2012-07-11 DIAGNOSIS — L918 Other hypertrophic disorders of the skin: Secondary | ICD-10-CM

## 2012-07-11 DIAGNOSIS — L919 Hypertrophic disorder of the skin, unspecified: Secondary | ICD-10-CM

## 2012-07-11 NOTE — Progress Notes (Signed)
Here for skin tag removal. Also c/o rlq pain.  H/o kidney stones but recently had neg renal u/s but taking flomax and hydrocodone.  Area prepped per protocol Lesion excised abx ointment placed with bandaid  A/P Next available for pelvic u/s to f/u rlq pain Pt requesting secondary to pain on rt

## 2012-07-26 ENCOUNTER — Other Ambulatory Visit: Payer: Self-pay | Admitting: Physician Assistant

## 2012-07-26 ENCOUNTER — Telehealth: Payer: Self-pay | Admitting: Radiology

## 2012-07-26 ENCOUNTER — Ambulatory Visit (INDEPENDENT_AMBULATORY_CARE_PROVIDER_SITE_OTHER): Payer: 59 | Admitting: Physician Assistant

## 2012-07-26 VITALS — BP 149/87 | HR 103 | Temp 98.7°F | Resp 16 | Ht 62.0 in | Wt 170.2 lb

## 2012-07-26 DIAGNOSIS — B029 Zoster without complications: Secondary | ICD-10-CM

## 2012-07-26 MED ORDER — VALACYCLOVIR HCL 1 G PO TABS: 1000.0000 mg | ORAL_TABLET | Freq: Three times a day (TID) | ORAL | Status: DC

## 2012-07-26 MED ORDER — HYDROCODONE-ACETAMINOPHEN 5-500 MG PO TABS: 1.0000 | ORAL_TABLET | Freq: Three times a day (TID) | ORAL | Status: DC | PRN

## 2012-07-26 NOTE — Progress Notes (Signed)
  Subjective:    Patient ID: Jasmine Bartlett, female    DOB: Mar 14, 1966, 46 y.o.   MRN: [id number]  HPI 46 yr old AAF presents with 1 day h/o moderate to severe pain in L ear, L lower jaw, L throat.  She also c/o of lower lip lip paresthesias.  She hasn't felt well over this past week.  She has had intermittent sneezing and runny nose. She feels sort of achy all over. No cough.  No n/v/d. She also c/o intermittent shooting pains on the lower left side of her face.  Review of Systems  All other systems reviewed and are negative.       Objective:   Physical Exam  Nursing note and vitals reviewed. Constitutional: She is oriented to person, place, and time. She appears well-developed and well-nourished.  HENT:  Head: Normocephalic and atraumatic.  Right Ear: External ear normal.  Left Ear: External ear normal.  Nose: Nose normal.  Mouth/Throat: No oropharyngeal exudate (throat with erythema on L and 2-74mm pustule/ulcerations, also present along floor of mouth).       B TM w/o erythema  Eyes: Conjunctivae normal are normal. Pupils are equal, round, and reactive to light.  Neck: Normal range of motion. Neck supple.       TTP along L eustachian tube and L side of neck. Skin along L lower mandible with TTP.  Cardiovascular: Normal rate, regular rhythm and normal heart sounds.   Pulmonary/Chest: Effort normal and breath sounds normal.  Neurological: She is alert and oriented to person, place, and time. No cranial nerve deficit. Coordination normal.  Skin: Skin is warm and dry.  Psychiatric: She has a normal mood and affect. Her behavior is normal. Thought content normal.       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  Take 800mg  of ibuprofen every 6-8 hours and add vicodin when needed. Viral culture taken.

## 2012-07-26 NOTE — Patient Instructions (Signed)
[facility] instructions printed out for patient. Fluids and rest

## 2012-07-26 NOTE — Telephone Encounter (Signed)
Spoke to patient she has questions about sinus problems and tooth pain she is having. Spoke to her and she is having facial pain and lower dental pain. I told her she should come in to be evaluated, she could have sinusitis or dental problems or both, she will come in.

## 2012-07-27 ENCOUNTER — Telehealth: Payer: Self-pay

## 2012-07-27 NOTE — Telephone Encounter (Signed)
patient notified and voiced understanding it takes 7-10 days for viral cx to come back.

## 2012-07-27 NOTE — Telephone Encounter (Signed)
PT WAS SEEN YESTERDAY BY ANGELA MCCLUNG AND SHE IS REQUESTING STATUS ON SHINGLES TEST SO THAT SHE MAY FORWARD INFO TO HER DENTIST    PT PHONE [phone]

## 2012-07-27 NOTE — Telephone Encounter (Signed)
PT STATES SHE HAD A QUESTION TO ASK A NURSE AND WE CALLED HER BACK, BUT SHE NOW HAVE ANOTHER QUESTION PLEASE CALL [phone]

## 2012-07-27 NOTE — Telephone Encounter (Signed)
Patient advised to use ice ibuprofen, to let us know if worse. She has c/o swelling, she has used heat advised to d/c heat and not to use again, ice is better.

## 2012-07-30 ENCOUNTER — Telehealth: Payer: Self-pay

## 2012-07-30 NOTE — Telephone Encounter (Signed)
PT WAS SEEN ON 07/26/12 BY ANGELA AND WAS DIAGNOSED WITH SHINGLES. PT STATES SHE IS STILL IN PAIN AND WANTS TO KNOW IF SHE CAN INCREASE THE DOSE OF ADVIL PLEASE CALL PT TO ADVISE

## 2012-07-31 NOTE — Telephone Encounter (Signed)
Pt CB with another question. She had heard back from her ENT who had treated her for a salivary gland infection. The ENT Rxd an Abx and a steroid for the pt because pt is not able to open her mouth very much at all. Pt wants to make sure that it is OK to take with the Valtrex and pain med she is on from [place]. Pt stated she told the ENT what she is taking from our office and they said it was OK. I advised the pt that she should ask the pharmacist when she p/up the new meds if there are any interactions between all of the meds. Also advised pt to be sure to take the Abx and the Advil she is taking w/food to minimize stomach upset. Pt agreed.

## 2012-07-31 NOTE — Telephone Encounter (Signed)
Pt reported that she is taking 4 tabs of Advil Q 6-8 hrs and wondered if she can take any more. Pt has only been taking the hydrocodone at bedtime, but she states she is staying home from work today d/t the pain, and face is still swollen. I advised pt that she should not inc the dose of Advil, but that she can take the hydrocodone during the day as Rxd, and can take some Advil in between doses of hydrocodone, just not any more tylenol products. Pt voiced understanding and stated she will try that and will RTC is she doesn't start to improve by tomorrow.

## 2012-08-03 ENCOUNTER — Telehealth: Payer: Self-pay

## 2012-08-03 NOTE — Telephone Encounter (Signed)
Pt would like to know if her test results were in please call [phone]

## 2012-08-04 NOTE — Telephone Encounter (Signed)
Pt notified that results not back yet and that Viral Cx's can sometimes take up to a month

## 2012-08-07 ENCOUNTER — Encounter: Payer: 59 | Admitting: Obstetrics and Gynecology

## 2012-08-07 ENCOUNTER — Other Ambulatory Visit: Payer: 59

## 2012-08-09 ENCOUNTER — Emergency Department (HOSPITAL_COMMUNITY)
Admission: EM | Admit: 2012-08-09 | Discharge: 2012-08-09 | Disposition: A | Discharge status: Home / Self Care | Payer: 59 | Attending: Emergency Medicine | Admitting: Emergency Medicine

## 2012-08-09 ENCOUNTER — Encounter (HOSPITAL_COMMUNITY): Payer: Self-pay

## 2012-08-09 ENCOUNTER — Emergency Department (HOSPITAL_COMMUNITY): Payer: 59

## 2012-08-09 DIAGNOSIS — Z8719 Personal history of other diseases of the digestive system: Secondary | ICD-10-CM | POA: Insufficient documentation

## 2012-08-09 DIAGNOSIS — Z8669 Personal history of other diseases of the nervous system and sense organs: Secondary | ICD-10-CM | POA: Insufficient documentation

## 2012-08-09 DIAGNOSIS — E119 Type 2 diabetes mellitus without complications: Secondary | ICD-10-CM | POA: Insufficient documentation

## 2012-08-09 DIAGNOSIS — R0602 Shortness of breath: Secondary | ICD-10-CM | POA: Insufficient documentation

## 2012-08-09 DIAGNOSIS — IMO0001 Reserved for inherently not codable concepts without codable children: Secondary | ICD-10-CM | POA: Insufficient documentation

## 2012-08-09 DIAGNOSIS — R05 Cough: Secondary | ICD-10-CM | POA: Insufficient documentation

## 2012-08-09 DIAGNOSIS — M7989 Other specified soft tissue disorders: Secondary | ICD-10-CM

## 2012-08-09 DIAGNOSIS — M79609 Pain in unspecified limb: Secondary | ICD-10-CM

## 2012-08-09 DIAGNOSIS — R079 Chest pain, unspecified: Secondary | ICD-10-CM | POA: Insufficient documentation

## 2012-08-09 DIAGNOSIS — R059 Cough, unspecified: Secondary | ICD-10-CM | POA: Insufficient documentation

## 2012-08-09 DIAGNOSIS — Z79899 Other long term (current) drug therapy: Secondary | ICD-10-CM | POA: Insufficient documentation

## 2012-08-09 DIAGNOSIS — R0989 Other specified symptoms and signs involving the circulatory and respiratory systems: Secondary | ICD-10-CM | POA: Insufficient documentation

## 2012-08-09 DIAGNOSIS — R000 Tachycardia, unspecified: Secondary | ICD-10-CM | POA: Insufficient documentation

## 2012-08-09 DIAGNOSIS — J309 Allergic rhinitis, unspecified: Secondary | ICD-10-CM | POA: Insufficient documentation

## 2012-08-09 LAB — TROPONIN I: Troponin I: <0.3 ng/mL (ref <0.30)

## 2012-08-09 LAB — CBC WITH DIFFERENTIAL/PLATELET
Basophils Absolute: 0 10*3/uL (ref 0.0–0.1)
Basophils Relative: 0 % (ref 0–1)
Eosinophils Absolute: 0.1 10*3/uL (ref 0.0–0.7)
Lymphs Abs: 0.8 10*3/uL (ref 0.7–4.0)
MCH: 26.6 pg (ref 26.0–34.0)
MCHC: 32 g/dL (ref 30.0–36.0)
Neutrophils Relative %: 84 % — ABNORMAL HIGH (ref 43–77)
Platelets: 415 10*3/uL — ABNORMAL HIGH (ref 150–400)
RBC: 4.4 MIL/uL (ref 3.87–5.11)
RDW: 15.6 % — ABNORMAL HIGH (ref 11.5–15.5)

## 2012-08-09 LAB — BASIC METABOLIC PANEL
Calcium: 9.1 mg/dL (ref 8.4–10.5)
GFR calc Af Amer: >90 mL/min (ref >90)
GFR calc non Af Amer: >90 mL/min (ref >90)
Potassium: 3.6 mEq/L (ref 3.5–5.1)
Sodium: 135 mEq/L (ref 135–145)

## 2012-08-09 MED ORDER — AMOXICILLIN 500 MG PO CAPS: 1000.0000 mg | ORAL_CAPSULE | Freq: Two times a day (BID) | ORAL | Status: DC

## 2012-08-09 MED ORDER — IOHEXOL 350 MG/ML SOLN
100.0000 mL | Freq: Once | INTRAVENOUS | Status: AC | PRN
  Administered 2012-08-09: 100 mL via INTRAVENOUS

## 2012-08-09 MED ORDER — SODIUM CHLORIDE 0.9 % IV BOLUS (SEPSIS)
1000.0000 mL | Freq: Once | INTRAVENOUS | Status: AC
  Administered 2012-08-09: 1000 mL via INTRAVENOUS

## 2012-08-09 MED ORDER — KETOROLAC TROMETHAMINE 30 MG/ML IJ SOLN
30.0000 mg | Freq: Once | INTRAMUSCULAR | Status: AC
  Administered 2012-08-09: 30 mg via INTRAVENOUS
  Filled 2012-08-09: qty 1

## 2012-08-09 NOTE — ED Provider Notes (Addendum)
History     CSN: [id number]  Arrival date & time 08/09/12  4098   First MD Initiated Contact with Patient 08/09/12 [phone]      Chief Complaint  Patient presents with  . Chest Pain  . Cough    (Consider location/radiation/quality/duration/timing/severity/associated sxs/prior treatment) HPI Comments: PT comes in with cc of chest pain. Pt has hx of psuedotumor, gastritis. States that she has been having some cough, mostly non productive over the last few days, and then developed a pleuritic type chest pain starting y'day. The pain is midsternal, non radiating, and worse with inspiration and with cough. There is no hx of similar sx. This morning, patient woke up from her sleep gasping for air, which got her concerned. She states that she has been having these episodes intermittently over the past 2 months. She has no hx of DVT, PE, but does indicated having some calf tenderness in the LLQ.    Patient is a 47 y.o. female presenting with chest pain and cough. The history is provided by the patient.  Chest Pain Primary symptoms include shortness of breath and cough. Pertinent negatives for primary symptoms include no abdominal pain, no nausea, no vomiting and no dizziness.    Cough Associated symptoms include chest pain, myalgias and shortness of breath. Pertinent negatives include no headaches.    Past Medical History  Diagnosis Date  . Diabetes mellitus   . Pseudotumor cerebri     increase of ICF  . Gastritis   . Allergy     Past Surgical History  Procedure Date  . Tubal ligation   . Hernia repair     Family History  Problem Relation Age of Onset  . Kidney disease Mother   . Alzheimer's disease Maternal Grandmother     History  Substance Use Topics  . Smoking status: Never Smoker   . Smokeless tobacco: Never Used  . Alcohol Use: Yes     Comment: occ    OB History    Grav Para Term Preterm Abortions TAB SAB Ect Mult Living                  Review of Systems    Constitutional: Negative for activity change.  HENT: Negative for neck pain.   Eyes: Negative for visual disturbance.  Respiratory: Positive for cough and shortness of breath.   Cardiovascular: Positive for chest pain.  Gastrointestinal: Negative for nausea, vomiting and abdominal pain.  Genitourinary: Negative for dysuria.  Musculoskeletal: Positive for myalgias.  Neurological: Negative for dizziness, syncope and headaches.  Hematological: Does not bruise/bleed easily.    Allergies  Diamox and Percocet  Home Medications   Current Outpatient Rx  Name  Route  Sig  Dispense  Refill  . VITAMIN C PO   Oral   Take 1 tablet by mouth daily.         Marland Kitchen VITAMIN D 1000 UNITS PO TABS   Oral   Take 1,000 Units by mouth 2 (two) times daily.         Marland Kitchen CLINDAMYCIN HCL 150 MG PO CAPS   Oral   Take 300 mg by mouth 4 (four) times daily.         Marland Kitchen FOLIC ACID 1 MG PO TABS   Oral   Take 3 mg by mouth daily.          . FUROSEMIDE 20 MG PO TABS   Oral   Take 20 mg by mouth daily.         Marland Kitchen  HYDROCODONE-ACETAMINOPHEN 5-500 MG PO TABS   Oral   Take 1 tablet by mouth every 8 (eight) hours as needed for pain.   20 tablet   0   . IBUPROFEN 200 MG PO TABS   Oral   Take 800 mg by mouth every 6 (six) hours as needed. Pain         . CULTURELLE PO CAPS   Oral   Take 1 capsule by mouth daily.         Marland Kitchen LORATADINE 10 MG PO TABS   Oral   Take 10 mg by mouth daily.         . ADULT MULTIVITAMIN W/MINERALS CH   Oral   Take 1 tablet by mouth daily.         Marland Kitchen POTASSIUM CHLORIDE 20 MEQ PO PACK   Oral   Take 20 mEq by mouth as needed.          . RESTORA PO CAPS   Oral   Take 1 capsule by mouth daily.         Marland Kitchen TAMSULOSIN HCL 0.4 MG PO CAPS   Oral   Take 0.4 mg by mouth daily.           BP 128/85  Pulse 118  Temp 98.8 F (37.1 C) (Oral)  Resp 18  SpO2 100%  LMP 07/23/2012  Physical Exam  Nursing note and vitals reviewed. Constitutional: She is oriented  to person, place, and time. She appears well-developed.  HENT:  Head: Normocephalic and atraumatic.  Mouth/Throat: Oropharynx is clear and moist. No oropharyngeal exudate.  Eyes: Conjunctivae normal and EOM are normal. Pupils are equal, round, and reactive to light.  Neck: Normal range of motion. Neck supple. No JVD present.  Cardiovascular: Normal rate, regular rhythm and normal heart sounds.   Pulmonary/Chest: Effort normal and breath sounds normal. No respiratory distress.  Abdominal: Soft. Bowel sounds are normal. She exhibits no distension. There is no tenderness. There is no rebound and no guarding.  Musculoskeletal:       Mild LLE calf swelling, and tenderness  Lymphadenopathy:    She has no cervical adenopathy.  Neurological: She is alert and oriented to person, place, and time.  Skin: Skin is warm and dry.    ED Course  Procedures (including critical care time)   Labs Reviewed  CBC WITH DIFFERENTIAL  BASIC METABOLIC PANEL  D-DIMER, QUANTITATIVE  TROPONIN I   Dg Chest 2 View  08/09/2012  *RADIOLOGY REPORT*  Clinical Data: Cough, congestion and shortness of breath.  Chest pain.  CHEST - 2 VIEW  Comparison: PA and lateral chest 07/31/2003.  Findings: Mild elevation of the left hemidiaphragm is unchanged. Lungs are clear.  Heart size is normal.  No pneumothorax or pleural fluid.  IMPRESSION: Negative chest.   Original Report Authenticated By: Holley Dexter, M.D.      No diagnosis found.    MDM   Date: 08/09/2012  Rate: 101  Rhythm: sinus tachycardia  QRS Axis: normal  Intervals: normal  ST/T Wave abnormalities: nonspecific ST/T changes  Conduction Disutrbances:none  Narrative Interpretation:   Old EKG Reviewed: unchanged  Differential diagnosis includes: ACS syndrome CHF exacerbation Valvular disorder Myocarditis Pericarditis Pericardial effusion Pneumonia Pleural effusion Pulmonary edema PE Anemia COPD  Pt comes in with cc of cough, sob and  pleuritic chest pain. Though she has no risk factors for DVT, PE - she is tachycardic with the pleuritic chest pain and has some SOB x 2 months. Pt's  WELLS score is 4.5 - we will get CT PE for a r/o. She is having LLE pain - so we will get DVT scan to start off, and CT PE only if the DVT study is neg.    Derwood Kaplan, MD 08/09/12 [phone]  Pedis Doppler has come back normal and CT of the chest is come back normal. Vital signs have been rechecked and heart rate is down to 103. Have talked with the patient and she started taking clindamycin yesterday before the onset of chest pain. It is possible that she is having a side effect to the clindamycin so that'll be discontinued and she's given a prescription for amoxicillin. She has prescriptions for Vicodin at home and is advised that she can continue to take that for pain and she is to return should symptoms worsen. She is reassured that there is no evidence of any serious pathology at this point.   Ct Angio Chest W/cm &/or Wo Cm  08/09/2012  *RADIOLOGY REPORT*  Clinical Data: Cough, congestion, shortness of breath, leukocytosis, mid sternal chest pain, tachycardia  CT ANGIOGRAPHY CHEST  Technique:  Multidetector CT imaging of the chest using the standard protocol during bolus administration of intravenous contrast. Multiplanar reconstructed images including MIPs were obtained and reviewed to evaluate the vascular anatomy.  Contrast: OMNIPAQUE IOHEXOL 350 MG/ML SOLN  Comparison: Chest radiographs dated 08/09/2012  Findings: No evidence of pulmonary embolism.  The lungs are essentially clear.  Suspected mild left basilar atelectasis.  No focal consolidation.  No pleural effusion or pneumothorax.  Visualized thyroid is unremarkable.  The heart is normal in size.  No pericardial effusion.  No suspicious mediastinal, hilar, or axillary lymphadenopathy.  Visualized upper abdomen is notable for a small hiatal hernia.  Very mild degenerative changes of the  thoracic spine.  IMPRESSION: No evidence of pulmonary embolism.  No evidence of acute cardiopulmonary disease.   Original Report Authenticated By: Charline Bills, M.D.    Images viewed by me.   Dione Booze, MD 08/09/12 1027

## 2012-08-09 NOTE — ED Notes (Signed)
Pt states yesterday evening pt with cough congestion.  At about 4 am pt woke up with coughing, chest pain, and dyspnea.  Chills at home.  Nausea no vomiting.

## 2012-08-09 NOTE — ED Notes (Signed)
Ultrasound at bedside

## 2012-08-09 NOTE — Progress Notes (Signed)
Left:  No evidence of DVT, superficial thrombosis, or Baker's cyst.  Right:  Negative for DVT in the common femoral vein.  

## 2012-08-10 ENCOUNTER — Ambulatory Visit (INDEPENDENT_AMBULATORY_CARE_PROVIDER_SITE_OTHER): Payer: 59 | Admitting: Emergency Medicine

## 2012-08-10 VITALS — BP 131/80 | HR 114 | Temp 100.1°F | Resp 18 | Ht 62.5 in | Wt 172.0 lb

## 2012-08-10 DIAGNOSIS — R52 Pain, unspecified: Secondary | ICD-10-CM

## 2012-08-10 DIAGNOSIS — J111 Influenza due to unidentified influenza virus with other respiratory manifestations: Secondary | ICD-10-CM

## 2012-08-10 DIAGNOSIS — R509 Fever, unspecified: Secondary | ICD-10-CM

## 2012-08-10 LAB — POCT INFLUENZA A/B: Influenza A, POC: POSITIVE

## 2012-08-10 LAB — POCT CBC
Granulocyte percent: 80.8 %G — AB (ref 37–80)
HCT, POC: 39.7 % (ref 37.7–47.9)
Hemoglobin: 12 g/dL — AB (ref 12.2–16.2)
MPV: 7.5 fL (ref 0–99.8)
POC Granulocyte: 10.2 — AB (ref 2–6.9)
POC MID %: 7.7 %M (ref 0–12)
RDW, POC: 17.5 %

## 2012-08-10 MED ORDER — OSELTAMIVIR PHOSPHATE 75 MG PO CAPS: 75.0000 mg | ORAL_CAPSULE | Freq: Two times a day (BID) | ORAL | Status: DC

## 2012-08-10 NOTE — Patient Instructions (Signed)
Influenza, Adult Influenza ("the flu") is a viral infection of the respiratory tract. It occurs more often in winter months because people spend more time in close contact with one another. Influenza can make you feel very sick. Influenza easily spreads from person to person (contagious). CAUSES   Influenza is caused by a virus that infects the respiratory tract. You can catch the virus by breathing in droplets from an infected person's cough or sneeze. You can also catch the virus by touching something that was recently contaminated with the virus and then touching your mouth, nose, or eyes. SYMPTOMS   Symptoms typically last 4 to 10 days and may include:  Fever.   Chills.   Headache, body aches, and muscle aches.   Sore throat.   Chest discomfort and cough.   Poor appetite.   Weakness or feeling tired.   Dizziness.   Nausea or vomiting.  DIAGNOSIS   Diagnosis of influenza is often made based on your history and a physical exam. A nose or throat swab test can be done to confirm the diagnosis. RISKS AND COMPLICATIONS You may be at risk for a more severe case of influenza if you smoke cigarettes, have diabetes, have chronic heart disease (such as heart failure) or lung disease (such as asthma), or if you have a weakened immune system. Elderly people and pregnant women are also at risk for more serious infections. The most common complication of influenza is a lung infection (pneumonia). Sometimes, this complication can require emergency medical care and may be life-threatening. PREVENTION   An annual influenza vaccination (flu shot) is the best way to avoid getting influenza. An annual flu shot is now routinely recommended for all adults in the [place] TREATMENT   In mild cases, influenza goes away on its own. Treatment is directed at relieving symptoms. For more severe cases, your caregiver may prescribe antiviral medicines to shorten the sickness. Antibiotic medicines are not effective,  because the infection is caused by a virus, not by bacteria. HOME CARE INSTRUCTIONS  Only take over-the-counter or prescription medicines for pain, discomfort, or fever as directed by your caregiver.   Use a cool mist humidifier to make breathing easier.   Get plenty of rest until your temperature returns to normal. This usually takes 3 to 4 days.   Drink enough fluids to keep your urine clear or pale yellow.   Cover your mouth and nose when coughing or sneezing, and wash your hands well to avoid spreading the virus.   Stay home from work or school until your fever has been gone for at least 1 full day.  SEEK MEDICAL CARE IF:    You have chest pain or a deep cough that worsens or produces more mucus.   You have nausea, vomiting, or diarrhea.  SEEK IMMEDIATE MEDICAL CARE IF:    You have difficulty breathing, shortness of breath, or your skin or nails turn bluish.   You have severe neck pain or stiffness.   You have a severe headache, facial pain, or earache.   You have a worsening or recurring fever.   You have nausea or vomiting that cannot be controlled.  MAKE SURE YOU:  Understand these instructions.   Will watch your condition.   Will get help right away if you are not doing well or get worse.  Document Released: 07/15/2000 Document Revised: 01/17/2012 Document Reviewed: 10/17/2011 [employer] Patient Information 2013 [employer].    

## 2012-08-10 NOTE — Progress Notes (Signed)
  Subjective:    Patient ID: Jasmine Bartlett, female    DOB: [birth year], 47 y.o.   MRN: [id number]  HPI Patient here for f/u from 12/26 for shingles. Outbreak was inside mouth on left side. In turns out it was a dental infection. Had route canal. Now fevers, body aches, chills. Trouble breathing yesterday, had visit to ER on 08/10/11. Patient was seen in the emergency room yesterday. She has CT angiogram as well as a chest x-ray. Her white count was elevated. The CT angiogram did not reveal any source of her chest pain.    Review of Systems     Objective:   Physical Exam HEENT exam reveals an alert female who is not in distress. Her neck is supple. Her chest is clear to both auscultation and percussion.  Results for orders placed in visit on 08/10/12  POCT INFLUENZA A/B      Component Value Range   Influenza A, POC Positive     Influenza B, POC Negative    POCT CBC      Component Value Range   WBC 12.4 (*) 4.6 - 10.2 K/uL   Lymph, poc 1.4  0.6 - 3.4   POC LYMPH PERCENT 11.5  10 - 50 %L   MID (cbc) 1.0 (*) 0 - 0.9   POC MID % 7.7  0 - 12 %M   POC Granulocyte 10.2 (*) 2 - 6.9   Granulocyte percent 80.8 (*) 37 - 80 %G   RBC 4.60  4.04 - 5.48 M/uL   Hemoglobin 12.0 (*) 12.2 - 16.2 g/dL   HCT, POC 95.6  21.3 - 47.9 %   MCV 86.4  80 - 97 fL   MCH, POC 26.1 (*) 27 - 31.2 pg   MCHC 30.2 (*) 31.8 - 35.4 g/dL   RDW, POC 08.6     Platelet Count, POC 448 (*) 142 - 424 K/uL   MPV 7.5  0 - 99.8 fL       Assessment & Plan:  Patient is fluid A+. We'll treat with Tamiflu for 5 days.

## 2012-08-20 ENCOUNTER — Other Ambulatory Visit: Payer: Self-pay | Admitting: Obstetrics and Gynecology

## 2012-08-20 DIAGNOSIS — R1031 Right lower quadrant pain: Secondary | ICD-10-CM

## 2012-08-21 ENCOUNTER — Encounter: Payer: 59 | Admitting: Obstetrics and Gynecology

## 2012-08-21 ENCOUNTER — Other Ambulatory Visit: Payer: 59

## 2012-09-12 ENCOUNTER — Other Ambulatory Visit: Payer: 59

## 2012-09-12 ENCOUNTER — Encounter: Payer: 59 | Admitting: Obstetrics and Gynecology

## 2012-11-02 ENCOUNTER — Ambulatory Visit: Payer: 59

## 2012-11-02 DIAGNOSIS — R0989 Other specified symptoms and signs involving the circulatory and respiratory systems: Secondary | ICD-10-CM

## 2012-11-02 DIAGNOSIS — G4710 Hypersomnia, unspecified: Secondary | ICD-10-CM

## 2012-11-02 DIAGNOSIS — R0681 Apnea, not elsewhere classified: Secondary | ICD-10-CM

## 2012-11-02 DIAGNOSIS — R0609 Other forms of dyspnea: Secondary | ICD-10-CM

## 2012-11-09 ENCOUNTER — Other Ambulatory Visit: Payer: Self-pay | Admitting: *Deleted

## 2012-11-09 DIAGNOSIS — G4710 Hypersomnia, unspecified: Secondary | ICD-10-CM

## 2012-11-09 DIAGNOSIS — G4730 Sleep apnea, unspecified: Secondary | ICD-10-CM

## 2012-11-11 ENCOUNTER — Other Ambulatory Visit: Payer: Self-pay | Admitting: Neurology

## 2012-11-11 ENCOUNTER — Telehealth: Payer: Self-pay | Admitting: Neurology

## 2012-11-11 DIAGNOSIS — G4733 Obstructive sleep apnea (adult) (pediatric): Secondary | ICD-10-CM

## 2012-11-11 NOTE — Telephone Encounter (Signed)
Please call patient to notify her that her sleep study was fairly benign. There is no significant obstructive sleep apnea. She does have some snoring and grinding of her teeth. Please ask her to followup with me in 6 months. I would recommend continuation of using her bite guard at night and trying to lose weight to help her snoring. Thanks

## 2012-11-28 ENCOUNTER — Other Ambulatory Visit: Payer: Self-pay | Admitting: *Deleted

## 2012-11-28 ENCOUNTER — Encounter: Payer: Self-pay | Admitting: Neurology

## 2013-04-13 ENCOUNTER — Emergency Department (HOSPITAL_COMMUNITY)
Admission: EM | Admit: 2013-04-13 | Discharge: 2013-04-13 | Disposition: A | Discharge status: Home / Self Care | Payer: 59 | Attending: Emergency Medicine | Admitting: Emergency Medicine

## 2013-04-13 ENCOUNTER — Encounter (HOSPITAL_COMMUNITY): Payer: Self-pay | Admitting: *Deleted

## 2013-04-13 ENCOUNTER — Emergency Department (HOSPITAL_COMMUNITY): Payer: 59

## 2013-04-13 DIAGNOSIS — N201 Calculus of ureter: Secondary | ICD-10-CM | POA: Insufficient documentation

## 2013-04-13 DIAGNOSIS — Z79899 Other long term (current) drug therapy: Secondary | ICD-10-CM | POA: Insufficient documentation

## 2013-04-13 DIAGNOSIS — E119 Type 2 diabetes mellitus without complications: Secondary | ICD-10-CM | POA: Insufficient documentation

## 2013-04-13 DIAGNOSIS — Z8719 Personal history of other diseases of the digestive system: Secondary | ICD-10-CM | POA: Insufficient documentation

## 2013-04-13 DIAGNOSIS — Z3202 Encounter for pregnancy test, result negative: Secondary | ICD-10-CM | POA: Insufficient documentation

## 2013-04-13 DIAGNOSIS — N23 Unspecified renal colic: Secondary | ICD-10-CM | POA: Insufficient documentation

## 2013-04-13 DIAGNOSIS — Z8669 Personal history of other diseases of the nervous system and sense organs: Secondary | ICD-10-CM | POA: Insufficient documentation

## 2013-04-13 DIAGNOSIS — R109 Unspecified abdominal pain: Secondary | ICD-10-CM | POA: Insufficient documentation

## 2013-04-13 LAB — URINALYSIS, ROUTINE W REFLEX MICROSCOPIC
Glucose, UA: NEGATIVE mg/dL
Ketones, ur: NEGATIVE mg/dL
Nitrite: NEGATIVE
Protein, ur: NEGATIVE mg/dL
pH: 6.5 (ref 5.0–8.0)

## 2013-04-13 LAB — URINE MICROSCOPIC-ADD ON

## 2013-04-13 LAB — POCT PREGNANCY, URINE: Preg Test, Ur: NEGATIVE

## 2013-04-13 MED ORDER — ONDANSETRON HCL 4 MG/2ML IJ SOLN
4.0000 mg | Freq: Once | INTRAMUSCULAR | Status: AC
  Administered 2013-04-13: 4 mg via INTRAVENOUS
  Filled 2013-04-13: qty 2

## 2013-04-13 MED ORDER — HYDROCODONE-ACETAMINOPHEN 5-325 MG PO TABS: 1.0000 | ORAL_TABLET | Freq: Four times a day (QID) | ORAL | Status: DC | PRN

## 2013-04-13 MED ORDER — HYDROMORPHONE HCL PF 1 MG/ML IJ SOLN
1.0000 mg | Freq: Once | INTRAMUSCULAR | Status: AC
  Administered 2013-04-13: 1 mg via INTRAVENOUS
  Filled 2013-04-13: qty 1

## 2013-04-13 MED ORDER — ONDANSETRON HCL 8 MG PO TABS: 8.0000 mg | ORAL_TABLET | Freq: Three times a day (TID) | ORAL | Status: DC | PRN

## 2013-04-13 MED ORDER — KETOROLAC TROMETHAMINE 30 MG/ML IJ SOLN
30.0000 mg | Freq: Once | INTRAMUSCULAR | Status: AC
  Administered 2013-04-13: 30 mg via INTRAVENOUS
  Filled 2013-04-13: qty 1

## 2013-04-13 MED ORDER — SODIUM CHLORIDE 0.9 % IV SOLN
INTRAVENOUS | Status: DC
  Administered 2013-04-13: 18:00:00 via INTRAVENOUS

## 2013-04-13 NOTE — ED Notes (Signed)
Per patient she is having right side pain that "goes down to my pelvis area" where it is intermittent. Patient states that she can barely walk and move when the pain is occuring. She remembers having kidney stone pain before and this pain is similar. Nauseated but no vomiting. Pain started this morning. Aleeve was taken by the patient around 10:30 but no relief occurred.

## 2013-04-13 NOTE — ED Notes (Signed)
Pt reports right sided flank pain that radiates to RLQ abd, started this morning. C/o nausea. Hx of kidney stones, pain feels similar

## 2013-04-13 NOTE — ED Provider Notes (Signed)
CSN: [id number]     Arrival date & time 04/13/13  1642 History   First MD Initiated Contact with Patient 04/13/13 1725     Chief Complaint  Patient presents with  . Flank Pain   (Consider location/radiation/quality/duration/timing/severity/associated sxs/prior Treatment) Patient is a 47 y.o. female presenting with flank pain. The history is provided by the patient.  Flank Pain Pertinent negatives include no chest pain, no abdominal pain, no headaches and no shortness of breath.  pt c/o right flank pain onset his am at rest. Constant, dull, waxes and wanes in intensity. Right flank posteriorly, radiating anteriorly. Similar  To prior kidney stone pain. Nausea. No vomiting or diarrhea. No pelvic pain. No vaginal discharge or bleeding. No dysuria or hematuria. No fever or chills. No back strain, fall or injury.     Past Medical History  Diagnosis Date  . Diabetes mellitus   . Pseudotumor cerebri     increase of ICF  . Gastritis   . Allergy    Past Surgical History  Procedure Laterality Date  . Tubal ligation    . Hernia repair     Family History  Problem Relation Age of Onset  . Kidney disease Mother   . Alzheimer's disease Maternal Grandmother    History  Substance Use Topics  . Smoking status: Never Smoker   . Smokeless tobacco: Never Used  . Alcohol Use: Yes     Comment: occ   OB History   Grav Para Term Preterm Abortions TAB SAB Ect Mult Living   3 2             Review of Systems  Constitutional: Negative for fever and chills.  HENT: Negative for neck pain.   Eyes: Negative for redness.  Respiratory: Negative for shortness of breath.   Cardiovascular: Negative for chest pain.  Gastrointestinal: Negative for abdominal pain.  Genitourinary: Positive for flank pain. Negative for vaginal bleeding and vaginal discharge.  Musculoskeletal: Negative for back pain.  Skin: Negative for rash.  Neurological: Negative for headaches.  Hematological: Does not bruise/bleed  easily.  Psychiatric/Behavioral: Negative for confusion.    Allergies  Clindamycin/lincomycin; Diamox; and Percocet  Home Medications   Current Outpatient Rx  Name  Route  Sig  Dispense  Refill  . Ascorbic Acid (VITAMIN C PO)   Oral   Take 1 tablet by mouth daily.         . cholecalciferol (VITAMIN D) 1000 UNITS tablet   Oral   Take 1,000 Units by mouth 2 (two) times daily.         . citalopram (CELEXA) 10 MG tablet   Oral   Take 10 mg by mouth daily.         . folic acid (FOLVITE) 1 MG tablet   Oral   Take 3 mg by mouth daily.          . furosemide (LASIX) 20 MG tablet   Oral   Take 20 mg by mouth daily.         . Multiple Vitamin (MULITIVITAMIN WITH MINERALS) TABS   Oral   Take 1 tablet by mouth daily.         . naproxen sodium (ANAPROX) 220 MG tablet   Oral   Take 440 mg by mouth 2 (two) times daily with a meal.         . Probiotic Product (RESTORA) CAPS   Oral   Take 1 capsule by mouth daily.  BP 133/84  Pulse 78  Temp(Src) 97.6 F (36.4 C) (Oral)  Resp 13  SpO2 100%  LMP 04/06/2013 Physical Exam  Nursing note and vitals reviewed. Constitutional: She appears well-developed and well-nourished. No distress.  HENT:  Mouth/Throat: Oropharynx is clear and moist.  Eyes: Conjunctivae are normal. No scleral icterus.  Neck: Neck supple. No tracheal deviation present.  Cardiovascular: Normal rate.   Pulmonary/Chest: Effort normal. No respiratory distress.  Abdominal: Soft. Normal appearance and bowel sounds are normal. She exhibits no distension and no mass. There is no tenderness. There is no rebound and no guarding.  Genitourinary:  No cva tenderness  Musculoskeletal: She exhibits no edema.  tls spine non tender  Neurological: She is alert.  Skin: Skin is warm and dry. No rash noted. She is not diaphoretic.  No shingles or rash in area of pain.   Psychiatric: She has a normal mood and affect.    ED Course  Procedures  (including critical care time)  Results for orders placed during the hospital encounter of 04/13/13  URINALYSIS, ROUTINE W REFLEX MICROSCOPIC      Result Value Range   Color, Urine YELLOW  YELLOW   APPearance CLEAR  CLEAR   Specific Gravity, Urine 1.013  1.005 - 1.030   pH 6.5  5.0 - 8.0   Glucose, UA NEGATIVE  NEGATIVE mg/dL   Hgb urine dipstick MODERATE (*) NEGATIVE   Bilirubin Urine NEGATIVE  NEGATIVE   Ketones, ur NEGATIVE  NEGATIVE mg/dL   Protein, ur NEGATIVE  NEGATIVE mg/dL   Urobilinogen, UA 0.2  0.0 - 1.0 mg/dL   Nitrite NEGATIVE  NEGATIVE   Leukocytes, UA TRACE (*) NEGATIVE  URINE MICROSCOPIC-ADD ON      Result Value Range   Squamous Epithelial / LPF RARE  RARE   WBC, UA 0-2  <3 WBC/hpf   RBC / HPF 7-10  <3 RBC/hpf  POCT PREGNANCY, URINE      Result Value Range   Preg Test, Ur NEGATIVE  NEGATIVE   Ct Abdomen Pelvis Wo Contrast  04/13/2013   CLINICAL DATA:  Right flank pain, history of stones, pelvic pain  EXAM: CT ABDOMEN AND PELVIS WITHOUT CONTRAST  TECHNIQUE: Multidetector CT imaging of the abdomen and pelvis was performed following the standard protocol without intravenous contrast.  COMPARISON:  08/25/2011  FINDINGS: Small hiatal hernia is noted. Lung bases are unremarkable. Sagittal images of the spine shows no destructive bony lesions.  Unenhanced liver shows no biliary ductal dilatation. Pancreas, spleen and adrenals are unremarkable. No calcified gallstones are noted within gallbladder. Abdominal aorta is unremarkable. Small umbilical hernia containing fat without evidence of acute complication.  There is nonobstructive calcified calculus in midpole of the right kidney measures 2.5 mm. There is mild right hydronephrosis and proximal right hydroureter. Mild right perinephric stranding.  In axial image 42 there is a calcified obstructive calculus in proximal right ureter measures 4 mm at the level of lower endplate of L4 vertebral body.  No pericecal inflammation. Normal  appendix is clearly visualize in axial image 51. Unenhanced uterus and adnexa are unremarkable. Bilateral distal ureter is unremarkable. No calcified calculi are noted within artery bladder. No inguinal adenopathy. No destructive bony lesions are noted within pelvis. Mild degenerative changes bilateral SI joints.  No small bowel obstruction. No ascites or free air. No adenopathy.  IMPRESSION: 1. There is mild right hydronephrosis and right hydroureter. Mild right perinephric stranding. 4 mm calcified calculus in right ureter at the level of lower endplate  of L4 vertebral body. 2. Right nonobstructive nephrolithiasis. 3. No calcified calculi are noted within urinary bladder. 4. Small hiatal hernia. 5. No pericecal inflammation. Normal appendix.   Electronically Signed   By: Natasha Mead   On: 04/13/2013 18:09      MDM  Iv ns. Dilaudid 1 mg iv. zofran iv. toradol iv.   Reviewed nursing notes and prior charts for additional history.    Recheck pt comfortable, pain controlled. abd soft nt.  Pt appears stable for d/c.  Discussed ct w pt, pt states is followed by [facility].      Suzi Roots, MD 04/13/13 [phone]

## 2013-07-10 ENCOUNTER — Other Ambulatory Visit (HOSPITAL_COMMUNITY): Payer: Self-pay | Admitting: Rheumatology

## 2013-07-10 DIAGNOSIS — R748 Abnormal levels of other serum enzymes: Secondary | ICD-10-CM

## 2013-07-16 ENCOUNTER — Encounter (HOSPITAL_COMMUNITY): Payer: 59

## 2013-07-16 ENCOUNTER — Ambulatory Visit (HOSPITAL_COMMUNITY): Payer: 59

## 2013-08-05 ENCOUNTER — Encounter (HOSPITAL_COMMUNITY)
Admission: RE | Admit: 2013-08-05 | Discharge: 2013-08-05 | Disposition: A | Discharge status: Home / Self Care | Payer: 59 | Source: Ambulatory Visit | Attending: Rheumatology | Admitting: Rheumatology

## 2013-08-05 DIAGNOSIS — R748 Abnormal levels of other serum enzymes: Secondary | ICD-10-CM

## 2013-08-05 MED ORDER — TECHNETIUM TC 99M MEDRONATE IV KIT
25.0000 | PACK | Freq: Once | INTRAVENOUS | Status: AC | PRN
  Administered 2013-08-05: 25 via INTRAVENOUS

## 2013-08-18 ENCOUNTER — Emergency Department: Payer: No Typology Code available for payment source

## 2013-08-18 ENCOUNTER — Emergency Department
Admission: EM | Admit: 2013-08-18 | Discharge: 2013-08-19 | Disposition: A | Discharge status: Home / Self Care | Payer: No Typology Code available for payment source | Attending: Emergency Medicine | Admitting: Emergency Medicine

## 2013-08-18 DIAGNOSIS — I499 Cardiac arrhythmia, unspecified: Secondary | ICD-10-CM

## 2013-08-18 DIAGNOSIS — IMO0001 Reserved for inherently not codable concepts without codable children: Secondary | ICD-10-CM

## 2013-08-18 DIAGNOSIS — M79602 Pain in left arm: Secondary | ICD-10-CM

## 2013-08-18 DIAGNOSIS — M79609 Pain in unspecified limb: Secondary | ICD-10-CM | POA: Insufficient documentation

## 2013-08-18 DIAGNOSIS — G932 Benign intracranial hypertension: Secondary | ICD-10-CM | POA: Insufficient documentation

## 2013-08-18 DIAGNOSIS — Z79899 Other long term (current) drug therapy: Secondary | ICD-10-CM | POA: Insufficient documentation

## 2013-08-18 MED ORDER — CYCLOBENZAPRINE HCL 10 MG PO TABS
10.0000 mg | ORAL_TABLET | Freq: Once | ORAL | Status: AC
Start: 2013-08-18 — End: 2013-08-18
  Administered 2013-08-18: 10 mg via ORAL
  Filled 2013-08-18: qty 1

## 2013-08-18 MED ORDER — IBUPROFEN 600 MG PO TABS
600.0000 mg | ORAL_TABLET | Freq: Once | ORAL | Status: AC
Start: 2013-08-18 — End: 2013-08-18
  Administered 2013-08-18: 600 mg via ORAL
  Filled 2013-08-18: qty 1

## 2013-08-18 MED ORDER — CYCLOBENZAPRINE HCL 10 MG PO TABS
10.0000 mg | ORAL_TABLET | Freq: Three times a day (TID) | ORAL | Status: AC | PRN
Start: 2013-08-18 — End: ?

## 2013-08-18 MED ORDER — IBUPROFEN 600 MG PO TABS
600.0000 mg | ORAL_TABLET | Freq: Four times a day (QID) | ORAL | Status: AC | PRN
Start: 2013-08-18 — End: ?

## 2013-08-18 NOTE — ED Provider Notes (Signed)
EMERGENCY DEPARTMENT HISTORY AND PHYSICAL EXAM     Physician/Midlevel provider first contact with patient: 08/18/13 2310         Date: 08/18/2013  Patient Name: Tracey Miller  Attending Physician: Ok Edwards MD  Diagnosis and Treatment Plan       Clinical Impression:   1. Left arm pain    2. Elevated blood pressure        Treatment Plan:   ED Disposition     Discharge Tracey Miller discharge to home/self care.    Condition at disposition: Stable            History of Presenting Illness     Chief Complaint   Patient presents with   . Arm Pain     Spasms left arm       History Provided By: pt  Chief Complaint: arm pain  Onset: yesterday  Timing: intermittent  Location: left upper arm  Quality: spastic  Severity: moderate  Modifying Factors: icy hot patch with no relief, worsens with movement  Associated sxs: dizziness (resolved), right thumb pain (x1 week ago)    Additional History: Tracey Miller is a 48 y.o. female presenting to the ED with c/o intermittent left upper arm pain x yesterday. Pt reports that yesterday she first felt dizzy then left arm pain arise but resolved before going to bed. Pt states that this morning at 10 AM, left arm pain returned. Pt describes sxs as a spastic sensation localized to the upper left arm and not radiating down her arm. Pt notes that she has applied icy hot patch on area resulting in no relief but has not taken any pain medications. Pt states that sxs worsens with movement so she began researching online which made her nervous prompting her to go to the ED for eval. Pt has a hx of borderline DM, kidney stones, and psuedo tumors.    Pt denies neck pain, fall, or CP. Pt denies injuring arm, family hx of heart disease, or social hx of smoking.     PCP: Pcp, [place], MD      Current facility-administered medications:cyclobenzaprine (FLEXERIL) tablet 10 mg, 10 mg, Oral, Once, Gohan Collister, MD;  ibuprofen (ADVIL,MOTRIN) tablet 600 mg, 600 mg, Oral, Once, Jujhar Everett,  Raynelle Chary, MD  Current outpatient prescriptions:citalopram (CELEXA) 10 MG tablet, Take 10 mg by mouth daily., Disp: , Rfl: ;  furosemide (LASIX) 20 MG tablet, Take 20 mg by mouth 2 (two) times daily., Disp: , Rfl: ;  loratadine (CLARITIN) 10 MG tablet, Take 10 mg by mouth daily., Disp: , Rfl: ;  cyclobenzaprine (FLEXERIL) 10 MG tablet, Take 1 tablet (10 mg total) by mouth 3 (three) times daily as needed for Muscle spasms., Disp: 20 tablet, Rfl: 0  ibuprofen (ADVIL,MOTRIN) 600 MG tablet, Take 1 tablet (600 mg total) by mouth every 6 (six) hours as needed for Pain., Disp: 20 tablet, Rfl: 0    Past Medical History     Past Medical History   Diagnosis Date   . Diabetes mellitus    . Calculus of kidney    . Pseudotumor cerebri syndrome      Past Surgical History   Procedure Date   . Tubal ligation        Family History     History reviewed. No pertinent family history.    Social History     History     Social History   . Marital Status: Single     Spouse Name: N/A  Number of Children: N/A   . Years of Education: N/A     Social History Main Topics   . Smoking status: Never Smoker    . Smokeless tobacco: Not on file   . Alcohol Use: No   . Drug Use: No   . Sexually Active: Not on file     Other Topics Concern   . Not on file     Social History Narrative   . No narrative on file        Allergies     Allergies   Allergen Reactions   . Clindamycin Nausea And Vomiting   . Diamox (Acetazolamide) Nausea And Vomiting   . Percocet (Oxycodone-Acetaminophen) Nausea And Vomiting   . Sulfa Antibiotics Nausea And Vomiting       Review of Systems     Review of Systems:  General: negative for chills, fever or malaise  Psychological: negative for depression or suicidal ideation  ENT: negative for epistaxis, nasal congestion or sore throat  Respiratory: no cough, shortness of breath, or wheezing  Cardiovascular: no chest pain or dyspnea on exertion  Gastrointestinal: no abdominal pain, change in bowel habits, or black or bloody  stools  Genito-Urinary: no dysuria, trouble voiding, or hematuria  Musculoskeletal: negative for - joint pain or swelling in leg - bilateral. + left arm pain and thumb pain. No neck pain or fall  Neurological: no TIA or stroke symptoms. + dizziness (resolved)  Dermatological: negative for pruritus and rash      Physical Exam     BP 145/94  Pulse 92  Temp 98.2 F (36.8 C)  Resp 18  Ht 1.549 m  Wt 74.844 kg  BMI 31.19 kg/m2  SpO2 98%  LMP 07/30/2013    Constitutional: Vital signs reviewed. Well appearing, no apparent distress  Neuro: alert and oriented x 3, CN II - XII intact, upper and lower extremities 5/5 strength intact, sensation intact, normal gait  Head: Normocephalic, atraumatic. No external trauma noted.  Eyes: Conjunctiva and sclera are normal. Extraocular movements intact, pupils equal, round, reactive  Ear, Nose, Throat:  Normal external examination of the nose and ears. Oropharynx clear, no tonsillar exudates. Uvula midline.  Neck: Normal range of motion. Trachea midline. No midline cervical spine tenderness. No cervical lymphadenopathy  Respiratory/Chest: Clear to auscultation. No respiratory distress. No wheezing, rhonchi or rales.  Cardiovascular: Regular rate and rhythm. No murmurs.  Abdomen: Soft. Non-tender to palpation. No masses. No guarding or rebound.  Back: No midline tenderness, no CVA tenderness.   Extremities: Upper and lower extremities with no edema, no cyanosis. Tenderness to left lateral upper arm, tenderness to left dorsal thenar eminence of left hand, radial pulse 2+, sensation intact, full strength and ROM of shoulder elbow and wrist  Skin: Warm and dry. No rash.    Diagnostic Study Results     Labs -     Results     ** No Results found for the last 24 hours. **          Radiologic Studies -   Radiology Results (24 Hour)     ** No Results found for the last 24 hours. **      .    Medical Decision Making   I am the first provider for this patient.    I reviewed the vital  signs, available nursing notes, past medical history, past surgical history, family history and social history.    Vital Signs-Reviewed the patient's vital signs.  Patient Vitals for the past 12 hrs:   BP Temp Pulse Resp   08/18/13 2304 145/94 mmHg 98.2 F (36.8 C) 92  18        EKG:  Interpreted by the Emergency Physician.   Time Interpreted: 11:28 PM   Rate: 77 bpm   Rhythm: Normal Sinus Rhythm with sinus arrhythmia   Interpretation: Normal EKG, no STEMI   Comparison: No prior study is available for comparison.    ED Course:     11:32 PM  Pt is feeling better. Discussed EKG results with pt and counseled on diagnosis, f/u plans, and signs and symptoms when to return to ED.  Pt is stable and ready for discharge.        Provider Notes: atraumatic pain to left upper arm muscles, did not take anything for pain, looked it up on Internet and became concerned    EKG stable, no chest pain, no SOB, low suspicion for ACS equivalent    Tenderness to palpation, worse with movement, intermittent    Will start on trial of ibuprofen and flexeril, to follow up with PCP in [facility] Notes     Throughout the stay in the Emergency Department, questions and concerns surrounding pain control, care plans, diagnostic studies, effects of medications administered or prescribed, and future prognostic dilemmas were assessed and addressed.    ROS addendum: The patient and/or family was asked if they had any other complaints or concerns that we could address today and nothing of significance was noted.     _______________________________  Attestations:    This note is prepared by Glenice Laine, acting as Scribe for Ok Edwards, MD.     Ok Edwards, MD: The scribe's documentation has been prepared under my direction and personally reviewed by me in its entirety.  I confirm that the note above accurately reflects all work, treatment, procedures, and medical decision making performed by me.        Ok Edwards,  MD  08/18/13 [phone]

## 2013-08-18 NOTE — ED Notes (Signed)
States felt dizzy yesterday which is now resolved, has had left upper arm pain/spasms yesterday got better, back again this afternoon.  Denies CP, HA and vision change.

## 2013-08-19 NOTE — Discharge Instructions (Signed)
Dear Tracey Miller:     Thank you for choosing the [facility] Emergency Dept for your healthcare needs, it was a privilege caring for you. We are genuinely concerned about your health and comfort and I hope your visit today was excellent.     Below is some information that our patients often find helpful.    Instructions:   Please follow-up with your primary care physician to get your blood pressure checked.    We recommend taking Ibuprofen and Flexeril as needed for discomfort.      We wish you good health and please do not hesitate to contact me if I can ever be of any assistance.     Sincerely,   Ok Edwards, MD  [facility] Emergency Department  [phone]    R.I.C.E.    Some things you can do to help your injury are: Resting, Icing, Compressing and Elevating the injured area. Remember this as "RICE."   REST: Limit the use of the injured body part.   ICE: By applying ice to the affected area, swelling and pain can be reduced. Place some ice cubes in a re-sealable (Ziploc) bag and add some water. Put a thin washcloth between the bag and the skin. Apply the ice bag to the area for at least 20 minutes. Do this at least 4 times per day. It is okay to do this more often than directed. You can also do it for longer than directed. NEVER APPLY ICE DIRECTLY TO THE SKIN.   COMPRESS: Compression means to apply pressure around the injured area such as with a splint, cast or an ace bandage. Compression decreases swelling and improves comfort. Compression should be tight enough to relieve swelling but not so tight as to decrease circulation. Increasing pain, numbness, tingling, or change in skin color, are all signs of decreased circulation.   ELEVATE: Elevate the injured part. For example, elevate your foot by placing it on a chair while sitting, or propping it up on pillows when lying down.    Elevated Blood Pressure    During your visit today your blood pressure was higher than normal.    Check  your blood pressure several times over the next several days, then follow up with your regular doctor. If you do not have a doctor, ask the medical staff to refer you to one.    You may need medication for your blood pressure if it stays high. Untreated high blood pressure can cause damage to your heart and kidneys and may lead to a heart attack or stroke. It is VERY IMPORTANT to follow up with your doctor.   Check your blood pressure daily and follow up with your physician.   A doctor will diagnose high blood pressure only if your blood pressure is high for several days. Many pharmacies have machines that let you check your own blood pressure. You can also check with a fire station to see whether a paramedic will take your blood pressure. Another option is to purchase a blood pressure monitor to use at home. These are available at most pharmacies.     YOU SHOULD SEEK MEDICAL ATTENTION IMMEDIATELY, EITHER HERE OR AT THE NEAREST EMERGENCY DEPARTMENT, IF ANY OF THE FOLLOWING OCCURS:   You have a sudden or severe headache.   You are numb, tingly, or weak on one side of your body, half of your face droops, or you have trouble speaking.   You have chest pain.   You are short  of breath.

## 2013-10-09 ENCOUNTER — Other Ambulatory Visit: Payer: Self-pay | Admitting: Neurology

## 2013-11-10 ENCOUNTER — Other Ambulatory Visit: Payer: Self-pay | Admitting: Neurology

## 2013-11-11 ENCOUNTER — Telehealth: Payer: Self-pay | Admitting: *Deleted

## 2013-11-11 MED ORDER — FUROSEMIDE 20 MG PO TABS: ORAL_TABLET | ORAL | Status: DC

## 2013-11-11 NOTE — Telephone Encounter (Signed)
We previously prescribed this in centricity.  Patient has an appt scheduled in August

## 2013-12-10 LAB — ECG 12-LEAD
Atrial Rate: 77 {beats}/min
P Axis: 54 degrees
P-R Interval: 170 ms
Q-T Interval: 372 ms
QRS Duration: 74 ms
QTC Calculation (Bezet): 420 ms
R Axis: 26 degrees
T Axis: 46 degrees
Ventricular Rate: 77 {beats}/min

## 2014-01-09 ENCOUNTER — Telehealth: Payer: Self-pay | Admitting: Neurology

## 2014-01-09 NOTE — Telephone Encounter (Signed)
Patient would like a call back from a nurse regarding if it is okay to travel with her condition. She plans on flying on 01/22/2014 but wants to know if there are any precautions she should take. Please call to advise.

## 2014-01-10 NOTE — Telephone Encounter (Signed)
Please advise patient that I have not seen her in over a year and do not know how well controlled she has been with her pseudotumor and therefore cannot make any recommendations regarding flying are not flying. Generally speaking there is no contraindication to flying with a diagnosis of pseudotumor cerebri.

## 2014-01-10 NOTE — Telephone Encounter (Signed)
Patient is going to [place](3 Hr straight flight) and would like to know if she should be taking any precautions due to her pseudotumor cerebri

## 2014-01-10 NOTE — Telephone Encounter (Signed)
Shared Dr Teofilo PodAthar's message with patient, she verbalized understanding

## 2014-01-10 NOTE — Telephone Encounter (Signed)
Patient is returning call--please call. °

## 2014-03-13 ENCOUNTER — Other Ambulatory Visit: Payer: Self-pay

## 2014-03-13 DIAGNOSIS — Z1231 Encounter for screening mammogram for malignant neoplasm of breast: Secondary | ICD-10-CM

## 2014-03-31 ENCOUNTER — Ambulatory Visit: Payer: 59

## 2014-03-31 ENCOUNTER — Encounter: Payer: Self-pay | Admitting: Neurology

## 2014-03-31 ENCOUNTER — Ambulatory Visit (INDEPENDENT_AMBULATORY_CARE_PROVIDER_SITE_OTHER): Payer: 59 | Admitting: Neurology

## 2014-03-31 ENCOUNTER — Ambulatory Visit
Admission: RE | Admit: 2014-03-31 | Discharge: 2014-03-31 | Disposition: A | Discharge status: Home / Self Care | Payer: 59 | Source: Ambulatory Visit

## 2014-03-31 VITALS — BP 128/78 | HR 96 | Ht 61.0 in | Wt 178.0 lb

## 2014-03-31 DIAGNOSIS — R0683 Snoring: Secondary | ICD-10-CM

## 2014-03-31 DIAGNOSIS — G932 Benign intracranial hypertension: Secondary | ICD-10-CM

## 2014-03-31 DIAGNOSIS — H18609 Keratoconus, unspecified, unspecified eye: Secondary | ICD-10-CM

## 2014-03-31 DIAGNOSIS — R0609 Other forms of dyspnea: Secondary | ICD-10-CM

## 2014-03-31 DIAGNOSIS — R0989 Other specified symptoms and signs involving the circulatory and respiratory systems: Secondary | ICD-10-CM

## 2014-03-31 DIAGNOSIS — H18603 Keratoconus, unspecified, bilateral: Secondary | ICD-10-CM

## 2014-03-31 DIAGNOSIS — Z1231 Encounter for screening mammogram for malignant neoplasm of breast: Secondary | ICD-10-CM

## 2014-03-31 DIAGNOSIS — G4763 Sleep related bruxism: Secondary | ICD-10-CM

## 2014-03-31 DIAGNOSIS — E669 Obesity, unspecified: Secondary | ICD-10-CM

## 2014-03-31 MED ORDER — FUROSEMIDE 20 MG PO TABS: ORAL_TABLET | ORAL | Status: DC

## 2014-03-31 NOTE — Progress Notes (Signed)
Subjective:    Patient ID: Jasmine Bartlett is a 48 y.o. female.  HPI    Interim history:  [name] is a 48 year old right-handed woman with an underlying medical history of prediabetes, obesity, and kidney stones who presents for followup consultation for her history of PTC. She is unaccompanied today. I first met her on 09/21/2012, at which time I did not make any medication changes. She was complaining of pulsatile tinnitus at the time. She had a followup with Dr. Frederico Hamman her ophthalmologist scheduled at the time. I suggested she return for sleep study. She had a baseline sleep study on 11/02/2012 and I went over her test results with her in detail today. She had no significant sleep disordered breathing. Sleep efficiency was 86.6% with a sleep latency of 22.5 minutes. She had a mildly increased arousal index due to spontaneous arousals. She had an increased percentage of stage I sleep, and decreased percentage to sleep and increased percentage of REM sleep at 28% with a normal REM latency. She had no significant periodic leg movements of sleep. She had mild to moderate and at times loud snoring. Overall she had no significant sleep disordered breathing with an AHI of 1.6 per hour, baseline oxygen saturation of 96%, nadir of 90%. 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   Today, she reports seeing Dr. Frederico Hamman last year and she feels stable. She has had stable symptoms and continues to take low dose lasix, 20 mg on M, W, F. She had low K in the past. She had routine blood work with Dr. Baird Cancer this year. She has ongoing R pulsatile tinnitus, but no visual obscurations and no significant HAs, some HA around her menstrual cycle. She has no new complaints.   She was last seen by Dr. Erling Cruz on 01/30/12. An optometrist 3 years ago found on routine eye exam optic nerve swelling. Underwent MRI and intracranial MRA and MRV 01/21/2009 compromised by metal artifact from braces. MRV was normal with hypoplastic left  transverse sinus. CTA brain 02/03/2009 N. LP 02/17/2009 revealed an OP of 330 mm of water and closing pressure of 150. No CSF results. Blood work 01/19/2009: normal vit D, B12, uric acid, TSH, sedimentation rate, RA latex, magnesium, CPK, electrolytes, liver function tests, ANA, CBC, and LDL. Previous vitamin D 25 hydroxy levels were low at 27.2 and 24.3. HIV negative. Hemoglobin low in the 10.9-11.2 range. She noted pulsatile tinnitus in her right ear beginning in May 2010 and began Diamox 03/03/2009. She was unable to tolerate Diamox because of n/v. She started topirimate beginning 04/2010 but discontinued this 07/2010 when she read about the fact that it was use to treat seizures. 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 She has not had vertigo. Her weight 04/21/2009 was 157 pounds and 169 pounds in 7/13. She has a history of kidney stones 09/2011. She has had enlarged lymph nodes in her neck and saw ENT in 10/45/12. Audiogram showed mild right low frequency sensorineural hearing loss. She had a CT scan of the neck with contrast 3/11/013, normal  She has HAs intermittently denies any recent papilledema. She saw ophth, Dr. Frederico Hamman last year and has an appt next week. Her most pressing complaint is R sided pulsatile tinnitus. She is not having double vision or transient obscurations. 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 Her exercises include Yoga, Zumba, pilates, belly dancing  and walking. Her pseudotumor cerebri is treated  with furosemide  20 mg daily and effer K 20 mEq daily prn and diet. She did gain some wt over the holidays, espcially since she was on Prednisone and ABx for a tooth problem. She had the flu in Dec 2013.   Her Past Medical History Is Significant For: Past Medical History  Diagnosis Date  . Diabetes mellitus   . Pseudotumor cerebri     increase of ICF  .  Gastritis   . Allergy     Her Past Surgical History Is Significant For: Past Surgical History  Procedure Laterality Date  . Tubal ligation    . Hernia repair      Her Family History Is Significant For: Family History  Problem Relation Age of Onset  . Kidney disease Mother   . Alzheimer's disease Maternal Grandmother     Her Social History Is Significant For: History   Social History  . Marital Status: Single    Spouse Name: N/A    Number of Children: 2  . Years of Education: college   Occupational History  .  [place] History Main Topics  . Smoking status: Never Smoker   . Smokeless tobacco: Never Used  . Alcohol Use: Yes     Comment: occ  . Drug Use: No  . Sexual Activity: Yes    Birth Control/ Protection: Condom   Other Topics Concern  . None   Social History Narrative  . None    Her Allergies Are:  Allergies  Allergen Reactions  . Clindamycin/Lincomycin Nausea Only  . Diamox [Acetazolamide] Nausea And Vomiting    severe  . Percocet [Oxycodone-Acetaminophen] Nausea And Vomiting  :   Her Current Medications Are:  Outpatient Encounter Prescriptions as of 03/31/2014  Medication Sig  . Ascorbic Acid (VITAMIN C PO) Take 1 tablet by mouth daily.  . cholecalciferol (VITAMIN D) 1000 UNITS tablet Take 1,000 Units by mouth 2 (two) times daily.  . citalopram (CELEXA) 10 MG tablet Take 10 mg by mouth daily.  . folic acid (FOLVITE) 1 MG tablet Take 3 mg by mouth daily.   . furosemide (LASIX) 20 MG tablet TAKE 1 TABLET BY MOUTH ON MONDAYS, WEDNESDAYS AND FRIDAYS  . Multiple Vitamin (MULITIVITAMIN WITH MINERALS) TABS Take 1 tablet by mouth daily.  . Probiotic Product (RESTORA) CAPS Take 1 capsule by mouth daily.  . [DISCONTINUED] HYDROcodone-acetaminophen (NORCO/VICODIN) 5-325 MG per tablet Take 1-2 tablets by mouth every 6 (six) hours as needed for pain.  . [DISCONTINUED] naproxen sodium (ANAPROX) 220 MG tablet Take 440 mg by mouth 2 (two) times  daily with a meal.  . [DISCONTINUED] ondansetron (ZOFRAN) 8 MG tablet Take 1 tablet (8 mg total) by mouth every 8 (eight) hours as needed for nausea.  :  Review of Systems:  Out of a complete 14 point review of systems, all are reviewed and negative with the exception of these symptoms as listed below:   Review of Systems  Musculoskeletal: Positive for neck stiffness.       Joint pain  Allergic/Immunologic: Positive for environmental allergies.    Objective:  Neurologic Exam  Physical Exam Physical Examination:   Filed Vitals:   03/31/14 1542  BP: 128/78  Pulse: 96    General Examination: The patient is a very pleasant 48 y.o. female in no acute distress. She appears well-developed and well-nourished and well groomed.   On general exam: She is a very pleasant middle-aged woman in  no acute distress. HEENT exam: Normocephalic, atraumatic, pupils are equal, round and reactive to light. She has bilateral keratoconus. She has a corrective lens in place on the right eye. Funduscopic examination is difficult on the left especially since she's not wearing her corrective lens. On the right side funduscopic exam reveals a fairly well demarcated disc. Blood vessels look unremarkable. Hearing is intact. Visual fields are full by finger perimetry on the R, but very difficult on the left due to absent corrective lens. She has no carotid bruits. She has no temporal tenderness. She has no bruits on her temples. Tympanic membranes are clear bilaterally. Oropharynx examination reveals a mildly tight appearing airway secondary to narrow airway. She has a mildly erythematous pharynx. Tongue protrudes centrally and palate elevates symmetrically.  Chest is clear to auscultation.  Heart sounds are normal without murmurs, rubs or gallops.  Abdomen is soft, nontender with normal bowel sounds.  She has no pitting edema in the distal lower extremities and no varicose veins.  Neurologically: Mental status: The  patient is awake, alert and oriented in all 3 spheres. Her memory, attention, language and knowledge are appropriate. Cranial nerves are as described above. In addition, she has normal facial symmetry, normal speech. Motor exam: Normal bulk, strength and tone is noted. She has no drift, tremor or rebound. Romberg is negative. On bending down she has no visual obscurations. Reflexes are 2+ throughout. Cerebellar testing shows no dysmetria or intention tremor. She has no truncal or gait ataxia. Sensory exam is intact to light touch, pinprick, vibration and temperature sense. Gait, station and balance are unremarkable. Tandem walk is unremarkable.  Assessment and Plan:   In summary, Anginette D. Thielke is a very pleasant 48 y.o.-year old female with a history of prediabetes, obesity, and kidney stones who presents for followup consultation for her history of PTC. Her physical exam is stable and she feels stable. She has an appointment with her ophthalmologist in October. I advised her to keep that appointment to make sure her dilated eye exam and optic discs are fine. Her sleep study results were shared with her in detail today. Thankfully she does not have any significant sleep disordered breathing. She does have sleep related bruxism for which she does have an over-the-counter bite guard. She has no new complaints today and I suggested we continue with low dose Lasix. She takes 20 mg 3 days a week. She could not tolerate Diamox and because of her kidney stone history she's not a great candidate for Topamax. At this juncture, we mutually agreed to continue with the current management. Since she looks well, I will see her back in about 6 months. I requested that she call us with an update after seeing her ophthalmologist. I renewed her prescription for Lasix today.   I answered all her questions today and the patient was in agreement with the above outlined plan.

## 2014-03-31 NOTE — Patient Instructions (Signed)
We will continue with the current management, namely low dose lasix.  Follow up in 6 months.  Follow up with Dr. Karleen Hampshire and let me know what he says.

## 2014-06-02 ENCOUNTER — Encounter: Payer: Self-pay | Admitting: Neurology

## 2014-09-08 ENCOUNTER — Emergency Department (HOSPITAL_BASED_OUTPATIENT_CLINIC_OR_DEPARTMENT_OTHER)
Admission: EM | Admit: 2014-09-08 | Discharge: 2014-09-08 | Disposition: A | Discharge status: Home / Self Care | Payer: 59 | Attending: Emergency Medicine | Admitting: Emergency Medicine

## 2014-09-08 ENCOUNTER — Emergency Department (HOSPITAL_BASED_OUTPATIENT_CLINIC_OR_DEPARTMENT_OTHER): Payer: 59

## 2014-09-08 ENCOUNTER — Encounter (HOSPITAL_BASED_OUTPATIENT_CLINIC_OR_DEPARTMENT_OTHER): Payer: Self-pay | Admitting: Emergency Medicine

## 2014-09-08 DIAGNOSIS — E119 Type 2 diabetes mellitus without complications: Secondary | ICD-10-CM | POA: Insufficient documentation

## 2014-09-08 DIAGNOSIS — Z79899 Other long term (current) drug therapy: Secondary | ICD-10-CM | POA: Insufficient documentation

## 2014-09-08 DIAGNOSIS — Z791 Long term (current) use of non-steroidal anti-inflammatories (NSAID): Secondary | ICD-10-CM | POA: Insufficient documentation

## 2014-09-08 DIAGNOSIS — Z8719 Personal history of other diseases of the digestive system: Secondary | ICD-10-CM | POA: Diagnosis not present

## 2014-09-08 DIAGNOSIS — Z8669 Personal history of other diseases of the nervous system and sense organs: Secondary | ICD-10-CM | POA: Diagnosis not present

## 2014-09-08 DIAGNOSIS — R079 Chest pain, unspecified: Secondary | ICD-10-CM | POA: Diagnosis not present

## 2014-09-08 LAB — CBC WITH DIFFERENTIAL/PLATELET
BASOS ABS: 0 10*3/uL (ref 0.0–0.1)
BASOS PCT: 1 % (ref 0–1)
EOS ABS: 0.2 10*3/uL (ref 0.0–0.7)
EOS PCT: 3 % (ref 0–5)
HCT: 37.6 % (ref 36.0–46.0)
Hemoglobin: 11.7 g/dL — ABNORMAL LOW (ref 12.0–15.0)
Lymphocytes Relative: 23 % (ref 12–46)
Lymphs Abs: 1.5 10*3/uL (ref 0.7–4.0)
MCH: 26 pg (ref 26.0–34.0)
MCHC: 31.1 g/dL (ref 30.0–36.0)
MCV: 83.6 fL (ref 78.0–100.0)
Monocytes Absolute: 0.5 10*3/uL (ref 0.1–1.0)
Monocytes Relative: 7 % (ref 3–12)
NEUTROS ABS: 4.5 10*3/uL (ref 1.7–7.7)
NEUTROS PCT: 66 % (ref 43–77)
Platelets: 381 10*3/uL (ref 150–400)
RBC: 4.5 MIL/uL (ref 3.87–5.11)
RDW: 15.9 % — ABNORMAL HIGH (ref 11.5–15.5)
WBC: 6.7 10*3/uL (ref 4.0–10.5)

## 2014-09-08 LAB — D-DIMER, QUANTITATIVE: D-Dimer, Quant: 0.39 ug/mL-FEU (ref 0.00–0.48)

## 2014-09-08 LAB — BASIC METABOLIC PANEL
Anion gap: 2 — ABNORMAL LOW (ref 5–15)
BUN: 10 mg/dL (ref 6–23)
CALCIUM: 8.3 mg/dL — AB (ref 8.4–10.5)
CHLORIDE: 104 mmol/L (ref 96–112)
CO2: 29 mmol/L (ref 19–32)
CREATININE: 0.61 mg/dL (ref 0.50–1.10)
GFR calc non Af Amer: >90 mL/min (ref >90)
Glucose, Bld: 97 mg/dL (ref 70–99)
Potassium: 4 mmol/L (ref 3.5–5.1)
SODIUM: 135 mmol/L (ref 135–145)

## 2014-09-08 LAB — TROPONIN I: Troponin I: <0.03 ng/mL (ref <0.031)

## 2014-09-08 MED ORDER — IBUPROFEN 800 MG PO TABS
800.0000 mg | ORAL_TABLET | Freq: Once | ORAL | Status: AC
  Administered 2014-09-08: 800 mg via ORAL
  Filled 2014-09-08: qty 1

## 2014-09-08 MED ORDER — NAPROXEN 500 MG PO TABS: 500.0000 mg | ORAL_TABLET | Freq: Two times a day (BID) | ORAL | Status: AC

## 2014-09-08 NOTE — Discharge Instructions (Signed)

## 2014-09-08 NOTE — ED Notes (Signed)
MD at bedside. 

## 2014-09-08 NOTE — ED Provider Notes (Signed)
CSN: [id number]     Arrival date & time 09/08/14  0911 History   First MD Initiated Contact with Patient 09/08/14 [id number]     Chief Complaint  Patient presents with  . Chest Pain     (Consider location/radiation/quality/duration/timing/severity/associated sxs/prior Treatment) HPI Comments: The patient is a 49 year old female with a history of pseudotumor and gastritis. She has no hypertension, no diabetes, does not smoke cigarettes, no high cholesterol. She reports approximately 1 week of sternal and left-sided chest discomfort described as a pinching feeling that occurs, lasts several seconds and then goes away, she notes that it comes on more frequently when she was in the shower when the water hits her chest, she denies exertional symptoms though readily admits that she does not exert herself including no exercise, no stairs.  She has no coughing shortness breath fevers or swelling of the legs but does state that she has had some strange feelings in the back of her left calf recently. She denies estrogen use, travel, trauma, immobilization, recent surgery or history of deep venous thrombosis. Currently her symptoms are minimal. She has never had cardiac testing.  Patient is a 49 y.o. female presenting with chest pain. The history is provided by the patient and the spouse.  Chest Pain   Past Medical History  Diagnosis Date  . Diabetes mellitus   . Pseudotumor cerebri     increase of ICF  . Gastritis   . Allergy    Past Surgical History  Procedure Laterality Date  . Tubal ligation    . Hernia repair     Family History  Problem Relation Age of Onset  . Kidney disease Mother   . Alzheimer's disease Maternal Grandmother    History  Substance Use Topics  . Smoking status: Never Smoker   . Smokeless tobacco: Never Used  . Alcohol Use: Yes     Comment: occ   OB History    Gravida Para Term Preterm AB TAB SAB Ectopic Multiple Living   3 2             Review of Systems   Cardiovascular: Positive for chest pain.  All other systems reviewed and are negative.     Allergies  Clindamycin/lincomycin; Diamox; and Percocet  Home Medications   Prior to Admission medications   Medication Sig Start Date End Date Taking? Authorizing Provider  Ascorbic Acid (VITAMIN C PO) Take 1 tablet by mouth daily.   Yes Historical Provider, MD  cholecalciferol (VITAMIN D) 1000 UNITS tablet Take 1,000 Units by mouth 2 (two) times daily.   Yes Historical Provider, MD  citalopram (CELEXA) 10 MG tablet Take 10 mg by mouth daily.   Yes Historical Provider, MD  folic acid (FOLVITE) 1 MG tablet Take 3 mg by mouth daily.    Yes Historical Provider, MD  furosemide (LASIX) 20 MG tablet TAKE 1 TABLET BY MOUTH ON MONDAYS, WEDNESDAYS AND FRIDAYS 03/31/14  Yes Huston Foley, MD  Multiple Vitamin (MULITIVITAMIN WITH MINERALS) TABS Take 1 tablet by mouth daily.   Yes Historical Provider, MD  Probiotic Product (RESTORA) CAPS Take 1 capsule by mouth daily.   Yes Historical Provider, MD  naproxen (NAPROSYN) 500 MG tablet Take 1 tablet (500 mg total) by mouth 2 (two) times daily with a meal. 09/08/14   Vida Roller, MD   BP 149/92 mmHg  Pulse 70  Temp(Src) 98.8 F (37.1 C) (Oral)  Resp 10  Ht  (1.549 m)  Wt 175 lb (79.379 kg)  BMI 33.08 kg/m2  SpO2 100%  LMP 09/01/2014 Physical Exam  Constitutional: She appears well-developed and well-nourished. No distress.  HENT:  Head: Normocephalic and atraumatic.  Mouth/Throat: Oropharynx is clear and moist. No oropharyngeal exudate.  Eyes: Conjunctivae and EOM are normal. Pupils are equal, round, and reactive to light. Right eye exhibits no discharge. Left eye exhibits no discharge. No scleral icterus.  Neck: Normal range of motion. Neck supple. No JVD present. No thyromegaly present.  Cardiovascular: Normal rate, regular rhythm, normal heart sounds and intact distal pulses.  Exam reveals no gallop and no friction rub.   No murmur  heard. Pulmonary/Chest: Effort normal and breath sounds normal. No respiratory distress. She has no wheezes. She has no rales. She exhibits tenderness (tender to palpation over the left parasternal border, reproducible according to the patient).  Abdominal: Soft. Bowel sounds are normal. She exhibits no distension and no mass. There is no tenderness.  Musculoskeletal: Normal range of motion. She exhibits no edema or tenderness.  Lymphadenopathy:    She has no cervical adenopathy.  Neurological: She is alert. Coordination normal.  Skin: Skin is warm and dry. No rash noted. No erythema.  Psychiatric: She has a normal mood and affect. Her behavior is normal.  Nursing note and vitals reviewed.   ED Course  Procedures (including critical care time) Labs Review Labs Reviewed  CBC WITH DIFFERENTIAL/PLATELET - Abnormal; Notable for the following:    Hemoglobin 11.7 (*)    RDW 15.9 (*)    All other components within normal limits  BASIC METABOLIC PANEL - Abnormal; Notable for the following:    Calcium 8.3 (*)    Anion gap 2 (*)    All other components within normal limits  D-DIMER, QUANTITATIVE  TROPONIN I    Imaging Review Dg Chest 2 View  09/08/2014   CLINICAL DATA:  Left anterior chest pain for 1 week.  EXAM: CHEST  2 VIEW  COMPARISON:  CT chest and PA and lateral chest 08/09/2012.  FINDINGS: Heart size and mediastinal contours are within normal limits. Both lungs are clear. Visualized skeletal structures are unremarkable.  IMPRESSION: Negative exam.   Electronically Signed   By: Drusilla Kannerhomas  Dalessio M.D.   On: 09/08/2014 09:59     EKG Interpretation   Date/Time:  Monday September 08 2014 09:18:02 EST Ventricular Rate:  82 PR Interval:  170 QRS Duration: 82 QT Interval:  376 QTC Calculation: 439 R Axis:   57 Text Interpretation:  Normal sinus rhythm Normal ECG since last tracing no  significant change Confirmed by Doron Shake  MD, Shandra Szymborski ([id number]) on 09/08/2014  9:31:14 AM      MDM    Final diagnoses:  Chest pain    The patient is low risk for thromboembolism however her pulse ranges between 85 and 100, there is some concern that this could be DVT though low suspicion, d-dimer, chest x-ray, labs, EKG is totally unremarkable and the pattern of her pain is not consistent with acute coronary syndrome. Most likely to be chest wall discomfort.  Well appaering, VS and labs normal - pt given reassurance - stable for d/c.  Meds given in ED:  Medications  ibuprofen (ADVIL,MOTRIN) tablet 800 mg (800 mg Oral Given 09/08/14 0954)    New Prescriptions   NAPROXEN (NAPROSYN) 500 MG TABLET    Take 1 tablet (500 mg total) by mouth 2 (two) times daily with a meal.      Vida RollerBrian D Rosana Farnell, MD 09/08/14 [phone]

## 2014-09-08 NOTE — ED Notes (Signed)
Left sided Intermittent breast pain.  Pt states she is having some tingling to left arm and leg.  Pt states pain has been occuring over one week.  Denies other symptoms.  Pt states she is starting to have some right flank pain which she believes is a kidney stone coming on.

## 2014-09-08 NOTE — ED Notes (Signed)
D/c home with family- directed to pharmacy to pick up RX

## 2014-09-08 NOTE — ED Notes (Signed)
Patient transported to X-ray 

## 2014-09-29 ENCOUNTER — Ambulatory Visit (INDEPENDENT_AMBULATORY_CARE_PROVIDER_SITE_OTHER): Payer: 59 | Admitting: Neurology

## 2014-09-29 ENCOUNTER — Encounter: Payer: Self-pay | Admitting: Neurology

## 2014-09-29 VITALS — BP 136/84 | HR 98 | Temp 98.3°F | Resp 16 | Ht 62.0 in | Wt 189.0 lb

## 2014-09-29 DIAGNOSIS — G932 Benign intracranial hypertension: Secondary | ICD-10-CM

## 2014-09-29 DIAGNOSIS — H18603 Keratoconus, unspecified, bilateral: Secondary | ICD-10-CM | POA: Diagnosis not present

## 2014-09-29 MED ORDER — FUROSEMIDE 20 MG PO TABS: ORAL_TABLET | ORAL | Status: AC

## 2014-09-29 NOTE — Patient Instructions (Addendum)
We will continue with your current medications. When [facility], please make sure they check your eye background for papilledema, I had a hard time seeing into the left eye today, so am not 100% sure, if you have papilledema on the left side or not.  Follow up in 6 months, sooner if we need to.  Good [facility] at [facility]!

## 2014-09-29 NOTE — Progress Notes (Signed)
Subjective:    Patient ID: Jasmine Bartlett is a 49 y.o. female.  HPI     Interim history:   Jasmine Bartlett is a 49 year old right-handed woman with an underlying medical history of prediabetes, obesity, and kidney stones who presents for followup consultation for her history of PTC. She is unaccompanied today. I last saw her on 03/31/2014, at which time she reported being stable. She saw her eye doctor a year before. She said she had stable findings. She was taking Lasix 20 mg on days, Wednesdays and Fridays and we kept this the same. She had no visual symptoms and no significant headaches. She did have occasional visual obscurations.   Today she reports, that she saw an ophthalmologist at [facility], Dr. Maudie Mercury for her history of keratoconus bilaterally. She has had more issues with her left eye. She has blurry vision and decreased visual acuity. She says that her optometrist can no longer fit her for a contact lens and she needs to see a specialist. Last year she did not have a dilated eye exam with Dr. Frederico Hamman but as far as her symptoms of pseudotumor she has not had any recurrence of headaches or new blurry vision. She was in the emergency room earlier this month for chest pain. She had a chest x-ray, EKG, labs and findings spoke against acute coronary syndrome. She was diagnosed with musculoskeletal pain and was given naproxen which actually did help. She continues to take furosemide 20 mg 3 times a week. Blood work in the emergency room earlier this month showed normal potassium level.  I first met her on 09/21/2012, at which time I did not make any medication changes. She was complaining of pulsatile tinnitus at the time. She had a followup with Dr. Frederico Hamman her ophthalmologist scheduled at the time. I suggested she return for sleep study. She had a baseline sleep study on 11/02/2012 and I went over her test results with her in detail today. She had no significant sleep disordered breathing. Sleep efficiency  was 86.6% with a sleep latency of 22.5 minutes. She had a mildly increased arousal index due to spontaneous arousals. She had an increased percentage of stage I sleep, and decreased percentage to sleep and increased percentage of REM sleep at 28% with a normal REM latency. She had no significant periodic leg movements of sleep. She had mild to moderate and at times loud snoring. Overall she had no significant sleep disordered breathing with an AHI of 1.6 per hour, baseline oxygen saturation of 96%, nadir of 90%. 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   She was last seen by Dr. Erling Cruz on 01/30/12. An optometrist 3 years ago found on routine eye exam optic nerve swelling. Underwent MRI and intracranial MRA and MRV 01/21/2009 compromised by metal artifact from braces. MRV was normal with hypoplastic left transverse sinus. CTA brain 02/03/2009 N. LP 02/17/2009 revealed an OP of 330 mm of water and closing pressure of 150. No CSF results. Blood work 01/19/2009: normal vit D, B12, uric acid, TSH, sedimentation rate, RA latex, magnesium, CPK, electrolytes, liver function tests, ANA, CBC, and LDL. Previous vitamin D 25 hydroxy levels were low at 27.2 and 24.3. HIV negative. Hemoglobin low in the 10.9-11.2 range. She noted pulsatile tinnitus in her right ear beginning in May 2010 and began Diamox 03/03/2009. She was unable to tolerate Diamox because of n/v. She started topirimate beginning 04/2010 but discontinued this 07/2010 when she read about the fact that it was use to treat  seizures. 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 She has not had vertigo. Her weight 04/21/2009 was 157 pounds and 169 pounds in 7/13. She has a history of kidney stones 09/2011. She has had enlarged lymph nodes in her neck and saw ENT in 10/45/12. Audiogram showed mild right low frequency sensorineural hearing loss. She had a CT scan of the neck  with contrast 3/11/013, normal   She has HAs intermittently denies any recent papilledema. She saw ophth, Dr. Frederico Hamman in 2013 and had a FU since then. Her most pressing complaint is R sided pulsatile tinnitus. She is not having double vision or transient obscurations. 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 Her exercises include Yoga, Zumba, pilates, belly dancing and walking. Her pseudotumor cerebri was treated  with furosemide 20 mg daily K 20 mEq daily prn and with diet. She did gain some wt over the holidays, espcially since she was on Prednisone and ABx for a tooth problem. She had the flu in Dec 2013.    Her Past Medical History Is Significant For: Past Medical History  Diagnosis Date  . Diabetes mellitus   . Pseudotumor cerebri     increase of ICF  . Gastritis   . Allergy     Her Past Surgical History Is Significant For: Past Surgical History  Procedure Laterality Date  . Tubal ligation    . Hernia repair      Her Family History Is Significant For: Family History  Problem Relation Age of Onset  . Kidney disease Mother   . Alzheimer's disease Maternal Grandmother     Her Social History Is Significant For: History   Social History  . Marital Status: Single    Spouse Name: N/A  . Number of Children: 2  . Years of Education: college   Occupational History  .  [place] History Main Topics  . Smoking status: Never Smoker   . Smokeless tobacco: Never Used  . Alcohol Use: 0.0 oz/week    0 Standard drinks or equivalent per week     Comment: occ  . Drug Use: No  . Sexual Activity: Yes    Birth Control/ Protection: Condom   Other Topics Concern  . None   Social History Narrative    Her Allergies Are:  Allergies  Allergen Reactions  . Clindamycin/Lincomycin Nausea Only  . Diamox [Acetazolamide] Nausea And Vomiting    severe  . Percocet [Oxycodone-Acetaminophen] Nausea And Vomiting  . Sulfa Antibiotics Nausea And  Vomiting  :   Her Current Medications Are:  Outpatient Encounter Prescriptions as of 09/29/2014  Medication Sig  . Ascorbic Acid (VITAMIN C PO) Take 1 tablet by mouth daily.  . cholecalciferol (VITAMIN D) 1000 UNITS tablet Take 1,000 Units by mouth 2 (two) times daily.  . citalopram (CELEXA) 10 MG tablet Take 10 mg by mouth daily.  . folic acid (FOLVITE) 1 MG tablet Take 3 mg by mouth daily.   . furosemide (LASIX) 20 MG tablet TAKE 1 TABLET BY MOUTH ON MONDAYS, WEDNESDAYS AND FRIDAYS  . Multiple Vitamin (MULITIVITAMIN WITH MINERALS) TABS Take 1 tablet by mouth daily.  . naproxen (NAPROSYN) 500 MG tablet Take 1 tablet (500 mg total) by mouth 2 (two) times daily with a meal.  . Probiotic Product (RESTORA) CAPS Take 1 capsule by mouth daily.  :  Review of Systems:  Out  of a complete 14 point review of systems, all are reviewed and negative with the exception of these symptoms as listed below:   Review of Systems  Neurological:       Has appt with Dr. Maudie Mercury at [facility] center for possible eye surgery.     Objective:  Neurologic Exam  Physical Exam Physical Examination:   Filed Vitals:   09/29/14 1528  BP: 136/84  Pulse: 98  Temp: 98.3 F (36.8 C)  Resp: 16    General Examination: The patient is a very pleasant 49 y.o. female in no acute distress. She appears well-developed and well-nourished and well groomed.   On general exam: She is a very pleasant middle-aged woman in no acute distress. HEENT exam: Normocephalic, atraumatic, pupils are equal, round and reactive to light. She has bilateral keratoconus. She has a corrective lens in place on the right eye. Funduscopic examination is difficult on the left. She is not wearing any corrective lens on the L. On the right side funduscopic exam reveals a fairly well demarcated disc. Blood vessels look unremarkable. Hearing is intact. She has no carotid bruits. She has no temporal tenderness. She has no bruits on her temples. Tympanic  membranes are clear bilaterally. Oropharynx examination reveals a mildly tight appearing airway secondary to narrow airway. She has a mildly erythematous pharynx. Tongue protrudes centrally and palate elevates symmetrically.  Chest is clear to auscultation.  Heart sounds are normal without murmurs, rubs or gallops.  Abdomen is soft, nontender with normal bowel sounds.  She has no pitting edema in the distal lower extremities and no varicose veins.  Neurologically: Mental status: The patient is awake, alert and oriented in all 3 spheres. Her memory, attention, language and knowledge are appropriate. Cranial nerves are as described above. In addition, she has normal facial symmetry, normal speech. Motor exam: Normal bulk, strength and tone is noted. She has no drift, tremor or rebound. Romberg is negative. On bending down she has no visual obscurations. Reflexes are 2+ throughout. Cerebellar testing shows no dysmetria or intention tremor. She has no truncal or gait ataxia. Sensory exam is intact to light touch, pinprick, vibration and temperature sense. Gait, station and balance are unremarkable. Tandem walk is unremarkable.  Assessment and Plan:   In summary, Jasmine Bartlett is a very pleasant 49 year old female with a history of prediabetes, obesity, and kidney stones who presents for followup consultation for her history of PTC. Her physical exam is stable and she feels stable but she had more problems with her keratoconus on the left side and is going to see a specialist this week at [facility]. She may need a corneal transplant. She is advised to have her dilated eye exam to look for papilledema as I was not 100% sure as to the left eye exam today. It is difficult to see into her left eye. Her sleep study did not show any significant sleep disordered breathing. 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 She has no new complaints today from my end of things  and she appears to be stable. I would like for her to continue with Lasix 20 mg 3 times a week. I renewed her prescription today. Unfortunately, in the past she could not tolerate Diamox and because of her kidney stones she is not entirely safe to take Topamax. At this juncture, we mutually agreed to continue with the current management. I will see her back in about 6  months, sooner if needed. I answered all her questions today and the patient was in agreement with the above outlined plan.

## 2014-12-16 ENCOUNTER — Other Ambulatory Visit: Payer: Self-pay | Admitting: Obstetrics and Gynecology

## 2015-01-20 ENCOUNTER — Ambulatory Visit (INDEPENDENT_AMBULATORY_CARE_PROVIDER_SITE_OTHER): Payer: 59 | Admitting: Internal Medicine

## 2015-01-20 ENCOUNTER — Ambulatory Visit (INDEPENDENT_AMBULATORY_CARE_PROVIDER_SITE_OTHER): Payer: 59

## 2015-01-20 VITALS — BP 140/84 | HR 91 | Temp 98.8°F | Ht 61.5 in | Wt 192.2 lb

## 2015-01-20 DIAGNOSIS — R21 Rash and other nonspecific skin eruption: Secondary | ICD-10-CM

## 2015-01-20 DIAGNOSIS — D649 Anemia, unspecified: Secondary | ICD-10-CM

## 2015-01-20 DIAGNOSIS — M25531 Pain in right wrist: Secondary | ICD-10-CM | POA: Diagnosis not present

## 2015-01-20 DIAGNOSIS — R42 Dizziness and giddiness: Secondary | ICD-10-CM | POA: Diagnosis not present

## 2015-01-20 LAB — POCT CBC
GRANULOCYTE PERCENT: 70.3 % (ref 37–80)
HCT, POC: 36.4 % — AB (ref 37.7–47.9)
HEMOGLOBIN: 11.3 g/dL — AB (ref 12.2–16.2)
Lymph, poc: 1.9 (ref 0.6–3.4)
MCH, POC: 25.7 pg — AB (ref 27–31.2)
MCHC: 31.1 g/dL — AB (ref 31.8–35.4)
MCV: 82.5 fL (ref 80–97)
MID (CBC): 0.5 (ref 0–0.9)
MPV: 6.7 fL (ref 0–99.8)
PLATELET COUNT, POC: 455 10*3/uL — AB (ref 142–424)
POC Granulocyte: 5.7 (ref 2–6.9)
POC LYMPH PERCENT: 23.6 %L (ref 10–50)
POC MID %: 6.1 % (ref 0–12)
RBC: 4.41 M/uL (ref 4.04–5.48)
RDW, POC: 16.4 %
WBC: 8.1 10*3/uL (ref 4.6–10.2)

## 2015-01-20 LAB — POCT SEDIMENTATION RATE: POCT SED RATE: 35 mm/hr — AB (ref 0–22)

## 2015-01-20 MED ORDER — TRIAMCINOLONE ACETONIDE 0.5 % EX CREA: 1.0000 "application " | TOPICAL_CREAM | Freq: Three times a day (TID) | CUTANEOUS | Status: AC

## 2015-01-20 MED ORDER — MELOXICAM 15 MG PO TABS: 15.0000 mg | ORAL_TABLET | Freq: Every day | ORAL | Status: DC

## 2015-01-20 NOTE — Progress Notes (Signed)
Subjective:  This chart was scribed for Jasmine Sia, MD by Stann Ore, Medical Scribe. This patient was seen in Room 2 and the patient's care was started at 6:32 PM.     Patient ID: Jasmine Bartlett, female    DOB: 04/14/1966, 49 y.o.   MRN: [id number] Chief Complaint  Patient presents with  . Skin Problem    bug bites? x 1 week  . Dizziness    dizziness x 1 week  . Hand Pain    right hand pain when she moves her thumb x 1 week    HPI Jasmine Bartlett is a 49 y.o. female who presents to [facility] complaining of some skin problems. Last week, she noticed some possible bug bites on both her feet. It started with small swelling and itchiness. Few days ago, she went out of town to [place] and noticed she got more near her elbows and right knees. Last night around 8:00 PM, she noticed a new swelling on the back of her neck. She has checked her house and her car for bed bugs, and other insects. She denies having any pets. She denies anyone else at home with similar symptoms. Itchiness continues. There is no pain.  She also reports right wrist pain starting a week ago. She has tried rubbing cream on it. She denies this pain waking her up at night. No known injury. Is a right-handed Corporate treasurer. No redness.  She also notes getting dizzy starting a week ago as well. One morning, she felt dizzy and thought it was because she got out of bed too fast. It occurred again while she was sitting down. She describes that these episodes don't last long. She denies vision change, fever, diaphoresis.   She denies any GU/GI sxt   Her neurologist prescribed her Lasix. Dr Frances Furbish.   Patient Active Problem List   Diagnosis Date Noted  . Keratoconus, unspecified 02/28/2012    -  pseudotumor cerebri   Current Outpatient Prescriptions on File Prior to Visit  Medication Sig Dispense Refill  . Ascorbic Acid (VITAMIN C PO) Take 1 tablet by mouth daily.    . cholecalciferol (VITAMIN D) 1000 UNITS tablet  Take 1,000 Units by mouth 2 (two) times daily.    . citalopram (CELEXA) 10 MG tablet Take 10 mg by mouth daily.    . folic acid (FOLVITE) 1 MG tablet Take 3 mg by mouth daily.     . furosemide (LASIX) 20 MG tablet TAKE 1 TABLET BY MOUTH ON MONDAYS, WEDNESDAYS AND FRIDAYS 30 tablet 5  . Multiple Vitamin (MULITIVITAMIN WITH MINERALS) TABS Take 1 tablet by mouth daily.    . naproxen (NAPROSYN) 500 MG tablet Take 1 tablet (500 mg total) by mouth 2 (two) times daily with a meal. 30 tablet 0  . Probiotic Product (RESTORA) CAPS Take 1 capsule by mouth daily.     No current facility-administered medications on file prior to visit.       Review of Systems  Constitutional: Negative for fever, chills and diaphoresis.  HENT: Negative for congestion, rhinorrhea and sore throat.   Eyes: Negative for visual disturbance.  Gastrointestinal: Negative for nausea, vomiting and diarrhea.  Genitourinary: Negative for dysuria, urgency and frequency.  Musculoskeletal: Positive for arthralgias (right wrist).  Skin: Positive for rash.  Neurological: Positive for dizziness.       Objective:   Physical Exam  Constitutional: She is oriented to person, place, and time. She appears well-developed and well-nourished. No distress.  HENT:  Head: Normocephalic and atraumatic.  Mouth/Throat: Oropharynx is clear and moist.  Eyes: Conjunctivae and EOM are normal. Pupils are equal, round, and reactive to light.  Neck: Neck supple. No thyromegaly present.  Cardiovascular: Normal rate, regular rhythm and normal heart sounds.   No murmur heard. Pulmonary/Chest: Effort normal. No respiratory distress.  Musculoskeletal: Normal range of motion. She exhibits no edema.  She is tender at the base of the right thumb and the first [facility] area with palpation, and range of motion against resistance. There is no redness or swelling. The wrist otherwise moves well. Finkelstein's is negative.  Lymphadenopathy:    She has no cervical  adenopathy.  Neurological: She is alert and oriented to person, place, and time. No cranial nerve deficit.  Skin: Skin is warm and dry.  She has a 2 cm indurated red area on her right posterior shoulder that is pruritic with no signs of cellulitis or pus or vesiculation. She has 2 resolving areas that appears to have been similar to this on her left arm. She has 2 other areas right and left foot lesions that are hyperpigmented and scarred now without active signs of irritation. She has 21.5-2.0 cm round slightly red papules on her left shin. None of these are tender.  Psychiatric: She has a normal mood and affect. Her behavior is normal.  Nursing note and vitals reviewed.  BP 140/84 mmHg  Pulse 91  Temp(Src) 98.8 F (37.1 C) (Oral)  Ht 5' 1.5" (1.562 m)  Wt 192 lb 4 oz (87.204 kg)  BMI 35.74 kg/m2  SpO2 98%  LMP 01/09/2015  [facility] reading (PRIMARY) by  Dr. Merla Riches no bony abnormalities around the first [facility] Results for orders placed or performed in visit on 01/20/15  POCT CBC  Result Value Ref Range   WBC 8.1 4.6 - 10.2 K/uL   Lymph, poc 1.9 0.6 - 3.4   POC LYMPH PERCENT 23.6 10 - 50 %L   MID (cbc) 0.5 0 - 0.9   POC MID % 6.1 0 - 12 %M   POC Granulocyte 5.7 2 - 6.9   Granulocyte percent 70.3 37 - 80 %G   RBC 4.41 4.04 - 5.48 M/uL   Hemoglobin----stable since February except for the development of microcytosis  11.3 (A) 12.2 - 16.2 g/dL   HCT, POC 70.6 (A) 23.7 - 47.9 %   MCV 82.5 80 - 97 fL   MCH, POC 25.7 (A) 27 - 31.2 pg   MCHC 31.1 (A) 31.8 - 35.4 g/dL   RDW, POC 62.8 %   Platelet Count, POC 455 (A) 142 - 424 K/uL   MPV 6.7 0 - 99.8 fL       Assessment & Plan:  Wrist pain, acute, right - this is an acute tendinitis or sprain at the first [facility] and will be treated with a thumb spica splint, hot water range of motion, ice compresses, and meloxicam for 2 weeks  Rash and nonspecific skin eruption - Plan: POCT CBC, Comprehensive metabolic panel, POCT SEDIMENTATION RATE  She is  concerned about possible underlying abnormalities and I will contact her with these lab results  Dizziness--short-lived and very episodic//with a normal neurological we will follow this for now  Mild anemia--microcytic--check FE TIBC  Meds ordered this encounter  Medications  . valACYclovir (VALTREX) 500 MG tablet    Sig: Take 500 mg by mouth 2 (two) times daily.  . Calcium-Magnesium-Vitamin D (CALCIUM 500 PO)    Sig: Take 2 capsules by  mouth daily.  Marland Kitchen triamcinolone cream (KENALOG) 0.5 %    Sig: Apply 1 application topically 3 (three) times daily.    Dispense:  30 g    Refill:  0  . meloxicam (MOBIC) 15 MG tablet    Sig: Take 1 tablet (15 mg total) by mouth daily.    Dispense:  15 tablet    Refill:  0     I have completed the patient encounter in its entirety as documented by the scribe, with editing by me where necessary. Jemmie Rhinehart P. Merla Riches, M.D.

## 2015-01-21 LAB — COMPREHENSIVE METABOLIC PANEL
ALBUMIN: 3.9 g/dL (ref 3.5–5.2)
ALT: 12 U/L (ref 0–35)
AST: 14 U/L (ref 0–37)
Alkaline Phosphatase: 123 U/L — ABNORMAL HIGH (ref 39–117)
BUN: 8 mg/dL (ref 6–23)
CHLORIDE: 103 meq/L (ref 96–112)
CO2: 28 meq/L (ref 19–32)
Calcium: 8.9 mg/dL (ref 8.4–10.5)
Creat: 0.65 mg/dL (ref 0.50–1.10)
Glucose, Bld: 92 mg/dL (ref 70–99)
POTASSIUM: 3.9 meq/L (ref 3.5–5.3)
Sodium: 139 mEq/L (ref 135–145)
Total Bilirubin: 0.2 mg/dL (ref 0.2–1.2)
Total Protein: 7.7 g/dL (ref 6.0–8.3)

## 2015-01-21 LAB — IRON AND TIBC
%SAT: 6 % — AB (ref 20–55)
IRON: 20 ug/dL — AB (ref 42–145)
TIBC: 351 ug/dL (ref 250–470)
UIBC: 331 ug/dL (ref 125–400)

## 2015-01-22 ENCOUNTER — Other Ambulatory Visit: Payer: Self-pay

## 2015-01-22 MED ORDER — MELOXICAM 15 MG PO TABS: 15.0000 mg | ORAL_TABLET | Freq: Every day | ORAL | Status: AC

## 2015-01-22 NOTE — Telephone Encounter (Signed)
Dr Merla Riches, you Rxd only #15 of meloxicam at [facility], but pharm is sending a req to fill a 90 day supply. Do you want to send #90?

## 2015-01-25 ENCOUNTER — Encounter: Payer: Self-pay | Admitting: Internal Medicine

## 2015-01-28 ENCOUNTER — Telehealth: Payer: Self-pay | Admitting: Radiology

## 2015-01-28 NOTE — Telephone Encounter (Signed)
Pt is calling about her lab results °

## 2015-01-28 NOTE — Telephone Encounter (Signed)
Letter sent 6/26 Labs=low fe needs repl as outlined in letter

## 2015-03-31 ENCOUNTER — Ambulatory Visit: Payer: Self-pay | Admitting: Neurology

## 2015-10-15 ENCOUNTER — Other Ambulatory Visit: Payer: Self-pay | Admitting: Neurology

## 2019-10-08 ENCOUNTER — Telehealth: Payer: Self-pay

## 2019-10-08 NOTE — Telephone Encounter (Signed)
I returned the pt's call and left on her voicemail at her request that her last tetanus vaccination was 05/05/2009 and her last colonoscopy done at Dr. Kenna Gilbert office was on 12/06/2010.

## 2020-05-04 ENCOUNTER — Telehealth: Payer: Self-pay

## 2020-05-04 NOTE — Telephone Encounter (Signed)
I left the pt a message on her voicemail at her request that she was on celexa for anxiety.
